# Patient Record
Sex: Female | Born: 1970 | Race: White | Hispanic: No | Marital: Married | Smoking: Never smoker
Health system: Southern US, Community
[De-identification: ages and names within clinical notes are randomized; demographics above are authoritative.]

## PROBLEM LIST (undated history)

## (undated) DIAGNOSIS — E890 Postprocedural hypothyroidism: Secondary | ICD-10-CM

## (undated) DIAGNOSIS — J309 Allergic rhinitis, unspecified: Secondary | ICD-10-CM

## (undated) DIAGNOSIS — G43909 Migraine, unspecified, not intractable, without status migrainosus: Secondary | ICD-10-CM

## (undated) DIAGNOSIS — G44009 Cluster headache syndrome, unspecified, not intractable: Secondary | ICD-10-CM

## (undated) DIAGNOSIS — C73 Malignant neoplasm of thyroid gland: Secondary | ICD-10-CM

## (undated) DIAGNOSIS — R490 Dysphonia: Secondary | ICD-10-CM

## (undated) HISTORY — DX: Malignant neoplasm of thyroid gland: C73

## (undated) HISTORY — DX: Postprocedural hypothyroidism: E89.0

## (undated) HISTORY — DX: Dysphonia: R49.0

## (undated) HISTORY — PX: CARPAL TUNNEL WITH CUBITAL TUNNEL: SHX5608

## (undated) HISTORY — DX: Cluster headache syndrome, unspecified, not intractable: G44.009

## (undated) HISTORY — DX: Migraine, unspecified, not intractable, without status migrainosus: G43.909

## (undated) HISTORY — DX: Allergic rhinitis, unspecified: J30.9

## (undated) HISTORY — PX: TYMPANOSTOMY: SHX2586

---
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
## 2008-05-31 HISTORY — PX: ABDOMINAL HYSTERECTOMY: SHX81

## 2008-05-31 HISTORY — PX: MRI: SHX5353

## 2013-01-15 LAB — T4, FREE: T4,FREE (DIRECT): 1.2

## 2013-01-15 LAB — HEPATIC FUNCTION PANEL
ALBUMIN: 4.6
ALT: 17 U/L (ref 7–35)
AST: 22 U/L
Alkaline Phosphatase: 42 U/L
BILIRUBIN TOTAL: 0.3 mg/dL

## 2013-01-15 LAB — TSH: TSH: 2.39

## 2013-01-15 LAB — CBC
HGB: 13.7 g/dL
WBC: 6.1
platelet count: 221

## 2013-01-15 LAB — BASIC METABOLIC PANEL
Creat: 0.82
Glucose: 77
Potassium: 4.9 mmol/L
Sodium: 141 mmol/L (ref 137–147)

## 2013-01-15 LAB — VITAMIN B12
FOLATE: 18.5
Vitamin B-12: 786

## 2013-05-31 DIAGNOSIS — E890 Postprocedural hypothyroidism: Secondary | ICD-10-CM

## 2013-05-31 HISTORY — DX: Postprocedural hypothyroidism: E89.0

## 2013-08-16 ENCOUNTER — Encounter: Payer: Self-pay | Admitting: Family Medicine

## 2013-08-16 ENCOUNTER — Ambulatory Visit (INDEPENDENT_AMBULATORY_CARE_PROVIDER_SITE_OTHER): Admitting: Family Medicine

## 2013-08-16 ENCOUNTER — Telehealth: Payer: Self-pay

## 2013-08-16 VITALS — BP 116/82 | HR 86 | Temp 97.6°F | Ht 67.0 in | Wt 134.1 lb

## 2013-08-16 DIAGNOSIS — E049 Nontoxic goiter, unspecified: Secondary | ICD-10-CM

## 2013-08-16 DIAGNOSIS — G44009 Cluster headache syndrome, unspecified, not intractable: Secondary | ICD-10-CM | POA: Insufficient documentation

## 2013-08-16 DIAGNOSIS — E042 Nontoxic multinodular goiter: Secondary | ICD-10-CM | POA: Insufficient documentation

## 2013-08-16 DIAGNOSIS — E010 Iodine-deficiency related diffuse (endemic) goiter: Secondary | ICD-10-CM

## 2013-08-16 DIAGNOSIS — R030 Elevated blood-pressure reading, without diagnosis of hypertension: Secondary | ICD-10-CM

## 2013-08-16 DIAGNOSIS — G43909 Migraine, unspecified, not intractable, without status migrainosus: Secondary | ICD-10-CM

## 2013-08-16 DIAGNOSIS — R51 Headache: Secondary | ICD-10-CM

## 2013-08-16 DIAGNOSIS — IMO0001 Reserved for inherently not codable concepts without codable children: Secondary | ICD-10-CM | POA: Insufficient documentation

## 2013-08-16 DIAGNOSIS — R519 Headache, unspecified: Secondary | ICD-10-CM | POA: Insufficient documentation

## 2013-08-16 MED ORDER — VERAPAMIL HCL ER 180 MG PO CP24: 180.0000 mg | ORAL_CAPSULE | Freq: Every day | ORAL | Status: DC

## 2013-08-16 MED ORDER — VERAPAMIL HCL ER 180 MG PO TBCR: 180.0000 mg | EXTENDED_RELEASE_TABLET | Freq: Every day | ORAL | Status: DC

## 2013-08-16 NOTE — Telephone Encounter (Signed)
Vicky with [facility] left v/m; pt wants to start med today. Spoke with pharmacy and has already gotten rx electronically.

## 2013-08-16 NOTE — Assessment & Plan Note (Signed)
Several different variants described - see below.

## 2013-08-16 NOTE — Assessment & Plan Note (Addendum)
Main concern is what sound like cluster headaches - discussed with patient and provided with patient educational handout Will start verapamil - discussed possible constipation side effect as well as possible hypotension - pt regularly monitors blood pressure. Return in 1-2 mo for f/u of headache variants. A total of 40 minutes were spent face-to-face with the patient during this encounter and over half of that time was spent on counseling and coordination of care

## 2013-08-16 NOTE — Telephone Encounter (Signed)
Maria Ray with [facility] pharmacy; pt was seen today and Maria Ray cannot get verapamil 180 mg capsule; Capsules are not available and Maria Ray wants to know if verapamil tabs are OK to substitute. Maria Ray request cb today.

## 2013-08-16 NOTE — Assessment & Plan Note (Addendum)
Noted today - check thyroid ultrasound.  Ordered today. Pt states had recent comprehensive panel by prior PCP - will request recent lab work today.

## 2013-08-16 NOTE — Progress Notes (Signed)
Pre visit review using our clinic review tool, if applicable. No additional management support is needed unless otherwise documented below in the visit note. 

## 2013-08-16 NOTE — Progress Notes (Signed)
BP 116/82  Pulse 86  Temp(Src) 97.6 F (36.4 C) (Oral)  Ht 5\' 7"  (1.702 m)  Wt 134 lb 1.9 oz (60.836 kg)  BMI 21.00 kg/m2   CC: new pt to establish  Subjective:    Patient ID: Maria Ray, female    DOB: July 23, 1970, 43 y.o.   MRN: [id number]  HPI: Maria Ray is a 43 y.o. female presenting on 08/16/2013 for [place] presents with husband today to establish care.  previously saw Dr. Carmie Kanner - records requested today.  Previously lived in [place].  BP concern - 127-130/85-89 at home.  Checks at [facility] and [facility].  H/o some elevated blood pressures in past.    Some nosebleeds in past - Nose sprays worsened. Saw ENT s/p cauterization.  Stable now.  3 different headache patterns - h/o chronic headaches described as mild ache/throb - no neck stiffness or muscle spasm.  Wonders if sinus related - ?vasorhinitis. Rare migraines with nausea/vomiting/photo/phonophobia improve with laying down in quiet room - maybe 2-3 per year. Once a week awakens with piercing stabbing headache that lasts 2-3 days - associated with unilateral watery eye and rhinorrhea. Toradol has helped control headaches as well. H/o normal MRI 2010. Has not tolerated nasal steroids in past. Has not tried singulair. Claritin D/allegra D didn't really help.  No fevers/chills.  Preventative: Within last 6 months Well woman - stopped after hysterectomy 08/2008 mammo - has not had yet Td 2010  Lives with husband and daughter (2000) and several animals (rabbit and ferret) Occupation: self employed - [employer]: graduate degree Activity: no regular exercise, stays active at work Diet: some water, fruits/vegetables daily  Relevant past medical, surgical, family and social history reviewed and updated as indicated.  Allergies and medications reviewed and updated. No current outpatient prescriptions on file prior to visit.   No current facility-administered medications on file prior to visit.     Review of Systems Per HPI unless specifically indicated above    Objective:    BP 116/82  Pulse 86  Temp(Src) 97.6 F (36.4 C) (Oral)  Ht 5\' 7"  (1.702 m)  Wt 134 lb 1.9 oz (60.836 kg)  BMI 21.00 kg/m2  Physical Exam  Nursing note and vitals reviewed. Constitutional: She is oriented to person, place, and time. She appears well-developed and well-nourished. No distress.  HENT:  Head: Normocephalic and atraumatic.  Right Ear: Hearing, tympanic membrane, external ear and ear canal normal.  Left Ear: Hearing, tympanic membrane, external ear and ear canal normal.  Nose: Nose normal.  Mouth/Throat: Uvula is midline, oropharynx is clear and moist and mucous membranes are normal. No oropharyngeal exudate, posterior oropharyngeal edema or posterior oropharyngeal erythema.  Eyes: Conjunctivae and EOM are normal. Pupils are equal, round, and reactive to light. No scleral icterus.  Neck: Normal range of motion. Neck supple. Thyromegaly (large R nodule) present.  Cardiovascular: Normal rate, regular rhythm, normal heart sounds and intact distal pulses.   No murmur heard. Pulses:      Radial pulses are 2+ on the right side, and 2+ on the left side.  Pulmonary/Chest: Effort normal and breath sounds normal. No respiratory distress. She has no wheezes. She has no rales.  Musculoskeletal: Normal range of motion. She exhibits no edema.  Lymphadenopathy:    She has no cervical adenopathy.  Neurological: She is alert and oriented to person, place, and time. She has normal strength. No cranial nerve deficit or sensory deficit. She displays a negative  Romberg sign. Coordination and gait normal.  CN 2-12 intact Intact FTN  Skin: Skin is warm and dry. No rash noted.  Psychiatric: She has a normal mood and affect. Her behavior is normal. Judgment and thought content normal.   No results found for this or any previous visit.    Assessment & Plan:   Problem List Items Addressed This Visit    Cluster headache     Main concern is what sound like cluster headaches - discussed with patient and provided with patient educational handout Will start verapamil - discussed possible constipation side effect as well as possible hypotension - pt regularly monitors blood pressure. Return in 1-2 mo for f/u of headache variants. A total of 40 minutes were spent face-to-face with the patient during this encounter and over half of that time was spent on counseling and coordination of care    Relevant Medications      verapamil (VERELAN PM) 24 hr capsule   Elevated BP     Overall stable. continue to monitor closely at local pharmacies.    Frequent headaches - Primary     Several different variants described - see below.    Migraine     Rare migraines - 1-2 per year, not currently bothersome.    Thyromegaly     Noted today - check thyroid ultrasound.  Ordered today. Pt states had recent comprehensive panel by prior PCP - will request recent lab work today.    Relevant Orders      US Soft Tissue Head/Neck       Follow up plan: Return in about 2 months (around 10/16/2013), or as needed, for follow up visit.

## 2013-08-16 NOTE — Assessment & Plan Note (Signed)
Rare migraines - 1-2 per year, not currently bothersome.

## 2013-08-16 NOTE — Patient Instructions (Signed)
Pass by [facility] office for referral for thyroid ultrasound Start verapamil once daily to see if we can improve headaches, ?cluster headache Return in 1-2 months for follow up Good to meet you today, call us with questions.  Cluster Headache Cluster headaches are recognized by their pattern of deep, intense head pain. They normally occur on one side of your head, but they may "switch sides" in subsequent episodes. Typically, cluster headaches:   Are severe in nature.   Occur repeatedly over weeks to months and are followed by periods of no headaches.   Can last from 15 minutes to 3 hours.   Occur at the same time each day, often at night.   Occur several times a day. CAUSES The exact cause of cluster headaches is not known. Alcohol use may be associated with cluster headaches. SIGNS AND SYMPTOMS   Severe pain that begins in or around your eye or temple.   One-sided head pain.   Feeling sick to your stomach (nauseous).   Sensitivity to light.   Runny nose.   Eye redness, tearing, and nasal stuffiness on the side of your head where you are experiencing pain.   Sweaty, pale skin of the face.   Droopy or swollen eyelid.   Restlessness. DIAGNOSIS  Cluster headaches are diagnosed based on symptoms and a physical exam. Your health care provider may order a CT scan or an MRI of your head or lab tests to see if your headaches are caused by other medical conditions.  TREATMENT   Medicines for pain relief and to prevent recurrent attacks. Some people may need a combination of medicines.  Oxygen for pain relief.   Biofeedback programs to help reduce headache pain.  It may be helpful to keep a headache diary. This may help you find a trend for what is triggering your headaches. Your health care provider can develop a treatment plan.  HOME CARE INSTRUCTIONS  During cluster periods:   Follow a regular sleep schedule. Do not vary the amount and time that you sleep  from day to day. It is important to stay on the same schedule during a cluster period to help prevent headaches.   Avoid alcohol.   Stop smoking if you smoke.  SEEK MEDICAL CARE IF:  You have any changes from your previous cluster headaches either in intensity or frequency.   You are not getting relief from medicines you are taking.  SEEK IMMEDIATE MEDICAL CARE IF:   You faint.   You have weakness or numbness, especially on one side of your body or face.   You have double vision.   You have nausea or vomiting that is not relieved within several hours.   You cannot keep your balance or have difficulty talking or walking.   You have neck pain or stiffness.   You have a fever. MAKE SURE YOU:  Understand these instructions.   Will watch your condition.   Will get help right away if you are not doing well or get worse. Document Released: 05/17/2005 Document Revised: 03/07/2013 Document Reviewed: 12/07/2012 [facility] Patient Information 2014 [place].

## 2013-08-16 NOTE — Assessment & Plan Note (Signed)
Overall stable. continue to monitor closely at local pharmacies.

## 2013-08-16 NOTE — Telephone Encounter (Signed)
Pharmacy notified.

## 2013-08-16 NOTE — Telephone Encounter (Signed)
Ok to substitute. plz notify pharmacy.  

## 2013-08-17 ENCOUNTER — Encounter: Payer: Self-pay | Admitting: Family Medicine

## 2013-08-17 ENCOUNTER — Telehealth: Payer: Self-pay

## 2013-08-17 ENCOUNTER — Ambulatory Visit (INDEPENDENT_AMBULATORY_CARE_PROVIDER_SITE_OTHER): Admitting: Family Medicine

## 2013-08-17 VITALS — BP 126/84 | HR 76 | Temp 97.5°F | Wt 134.0 lb

## 2013-08-17 DIAGNOSIS — T50905A Adverse effect of unspecified drugs, medicaments and biological substances, initial encounter: Secondary | ICD-10-CM | POA: Insufficient documentation

## 2013-08-17 DIAGNOSIS — T887XXA Unspecified adverse effect of drug or medicament, initial encounter: Secondary | ICD-10-CM

## 2013-08-17 NOTE — Patient Instructions (Signed)
This was likely bad reaction to too extended release verapamil. Stop this med.  Return home and rest today.  Push small sips of fluids.  Update me if not improving over the weekend. Your blood pressure was ok today. Return to see me next month.

## 2013-08-17 NOTE — Telephone Encounter (Signed)
Spoke with patient. Likely due to verapamil - HR 64.  I've asked them to come in now for eval and vital check.  Aware if BP low will need to go to [facility] or ER for IVF. plz place on my schedule

## 2013-08-17 NOTE — Telephone Encounter (Signed)
Seen today. 

## 2013-08-17 NOTE — Telephone Encounter (Signed)
Pt placed on schedule as instructed.

## 2013-08-17 NOTE — Progress Notes (Signed)
   BP 126/84  Pulse 76  Temp(Src) 97.5 F (36.4 C) (Oral)  Wt 134 lb (60.782 kg)   CC: f/u  Subjective:    Patient ID: Maria Ray, female    DOB: 1970-10-26, 43 y.o.   MRN: [id number]  HPI: Maria Ray is a 43 y.o. female presenting on 08/17/2013 for Headache and Dizziness   See recent phone note and yesterday's office visit.  Briefly, yesterday dx with cluster headaches, started on verapamil SR 180mg  once nightly.  Took first pill last night, this morning awoke with chills, headache, nausea and vomiting, and dizziness.  Vomiting x 1.  Presents today for further eval.  Woke up at 4am and felt fine, then at 7:15am woke up with headache, ok during shower.  Started feeling ill at 8:50am.  + diarrhea, no constipation.  No chest pain.    No recent sick contacts.  Slowly feeling better. Drinking ok - staying well hydrated.  Relevant past medical, surgical, family and social history reviewed and updated as indicated.  Allergies and medications reviewed and updated. Current Outpatient Prescriptions on File Prior to Visit  Medication Sig  . Biotin 5000 MCG CAPS Take 10,000 mcg by mouth.  . cholecalciferol (VITAMIN D) 1000 UNITS tablet Take 1,000 Units by mouth daily.  . Garlic 1017 MG CAPS Take 1 capsule by mouth.  . Ginkgo Biloba 40 MG TABS Take 120 mg by mouth.  . magnesium oxide (MAG-OX) 400 MG tablet Take 800 mg by mouth daily.  . Multiple Vitamin (MULTIVITAMIN) tablet Take 1 tablet by mouth daily.  . OMEGA-3 KRILL OIL PO Take 1 capsule by mouth.   No current facility-administered medications on file prior to visit.    Review of Systems Per HPI unless specifically indicated above    Objective:    BP 126/84  Pulse 76  Temp(Src) 97.5 F (36.4 C) (Oral)  Wt 134 lb (60.782 kg)  Physical Exam  Nursing note and vitals reviewed. Constitutional: She appears well-developed and well-nourished. No distress.  HENT:  Mouth/Throat: Oropharynx is clear and moist. No oropharyngeal exudate.    Eyes: Conjunctivae and EOM are normal. Pupils are equal, round, and reactive to light. No scleral icterus.  Cardiovascular: Normal rate, regular rhythm, normal heart sounds and intact distal pulses.   No murmur heard. Pulmonary/Chest: Effort normal and breath sounds normal. No respiratory distress. She has no wheezes. She has no rales.  Musculoskeletal: She exhibits no edema.  Skin: Skin is warm and dry. No rash noted.  Psychiatric: She has a normal mood and affect.   No results found for this or any previous visit.    Assessment & Plan:   Problem List Items Addressed This Visit   Adverse drug reaction - Primary     Anticipate reaction to verapamil - with hypotension leading to nausea/dizziness.  Only took 1 180mg  SR calan dose. Seems to be resolving.  Provided with water, if tolerated ok to return home.  Orthostatics normal today. Less likely viral gastro.        Follow up plan: No Follow-up on file.

## 2013-08-17 NOTE — Assessment & Plan Note (Signed)
Anticipate reaction to verapamil - with hypotension leading to nausea/dizziness.  Only took 1 180mg  SR calan dose. Seems to be resolving.  Provided with water, if tolerated ok to return home.  Orthostatics normal today. Less likely viral gastro.

## 2013-08-17 NOTE — Progress Notes (Signed)
Pre visit review using our clinic review tool, if applicable. No additional management support is needed unless otherwise documented below in the visit note. 

## 2013-08-17 NOTE — Telephone Encounter (Signed)
[name] said pt took Verapamil last night at 7:30 PM; when pt woke up this morning had severe H/A with dizziness, nausea, weakness, pt felt like BP has dropped; does not have BP cuff at home. Presently pt is laying down and all symptoms continue with pain level 10. [address]Please advise.

## 2013-08-23 ENCOUNTER — Ambulatory Visit: Payer: Self-pay | Admitting: Family Medicine

## 2013-08-23 IMAGING — US THYROID ULTRASOUND
1 series · 14 of 25 positions shown · non-contrast
Comparison: None.

CLINICAL DATA: Thyromegaly

EXAM:
THYROID ULTRASOUND
TECHNIQUE: Ultrasound examination of the thyroid gland and adjacent soft
tissues was performed.

[Series 1: thyroid ultrasound · 0.08mm/px · 14 of 57 slices shown]
[im 1/57]
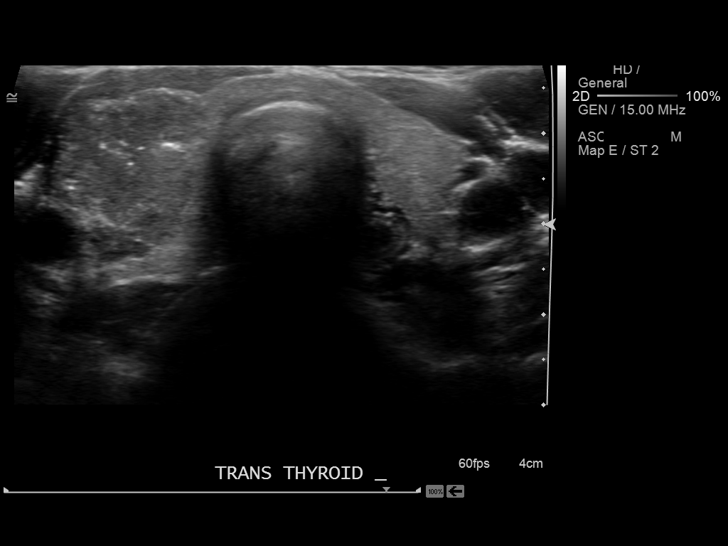
[im 5/57]
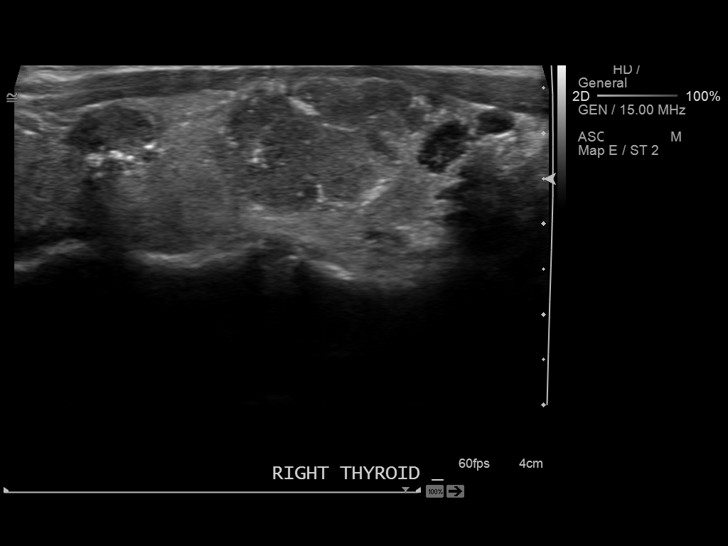
[im 10/57]
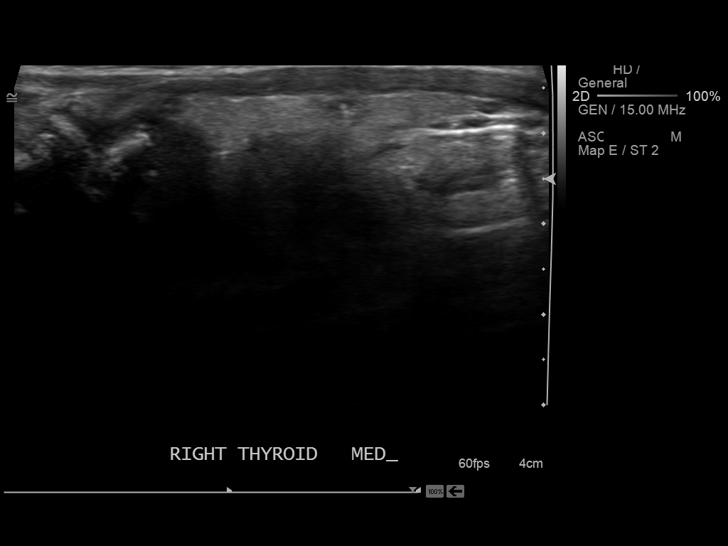
[im 15/57]
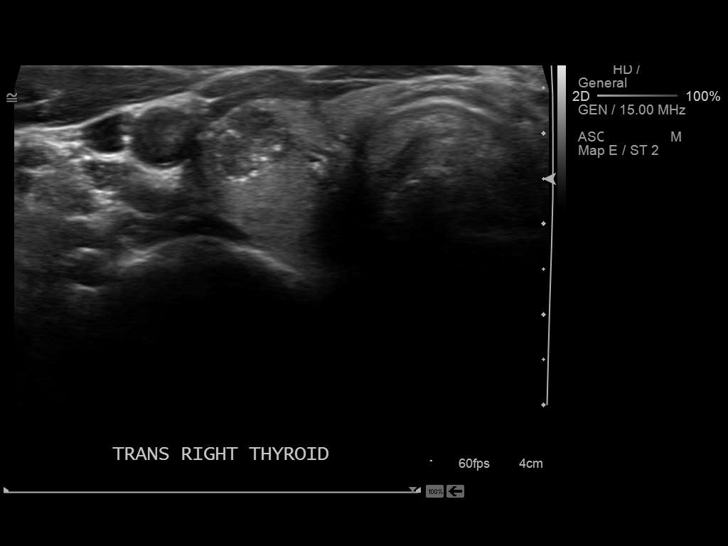
[im 19/57]
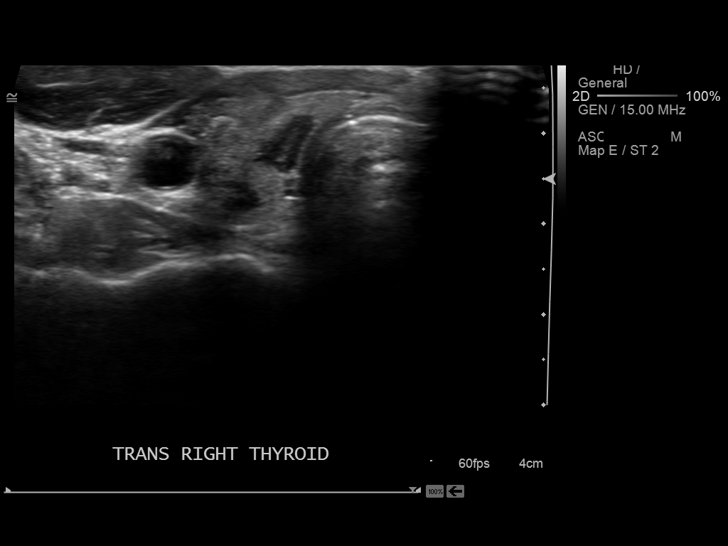
[im 22/57]
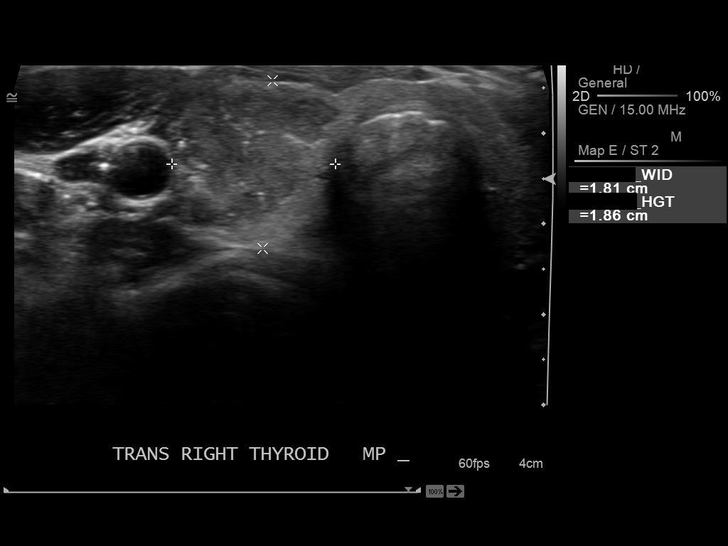
[im 26/57]
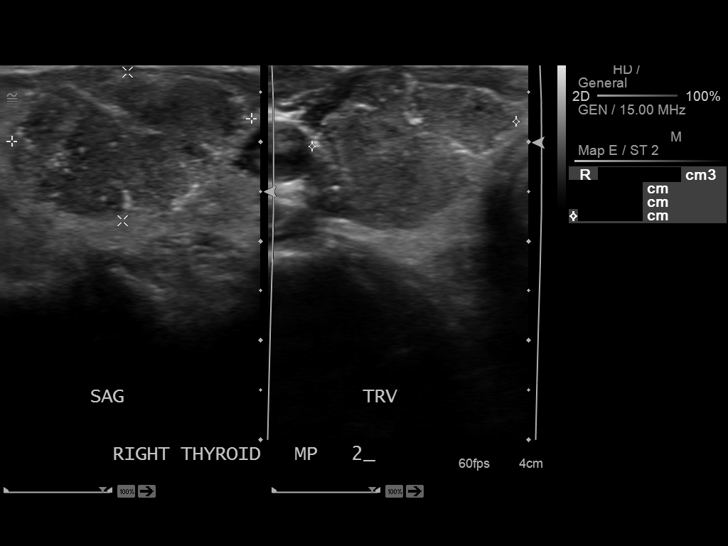
[im 31/57]
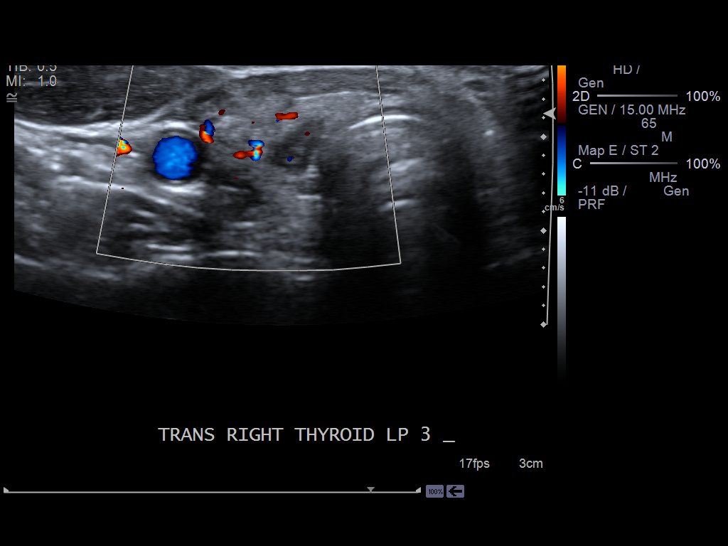
[im 36/57]
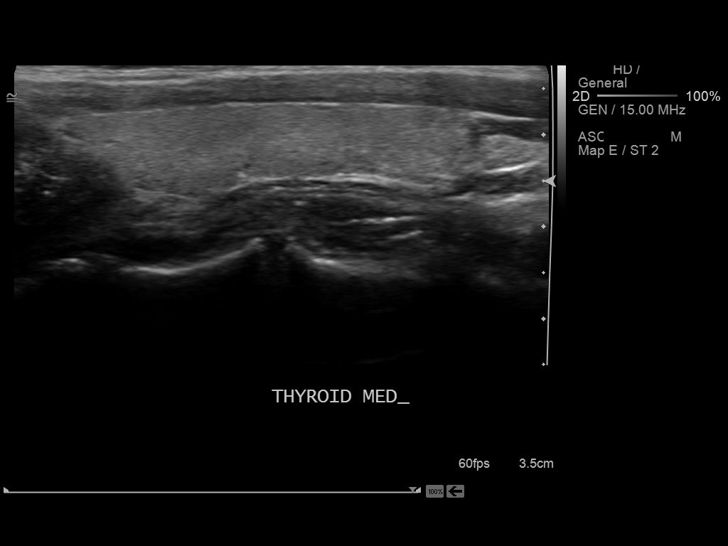
[im 38/57]
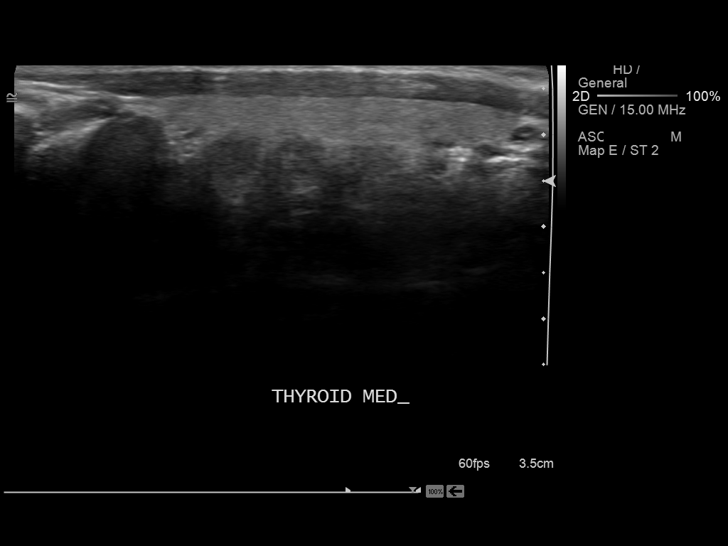
[im 43/57]
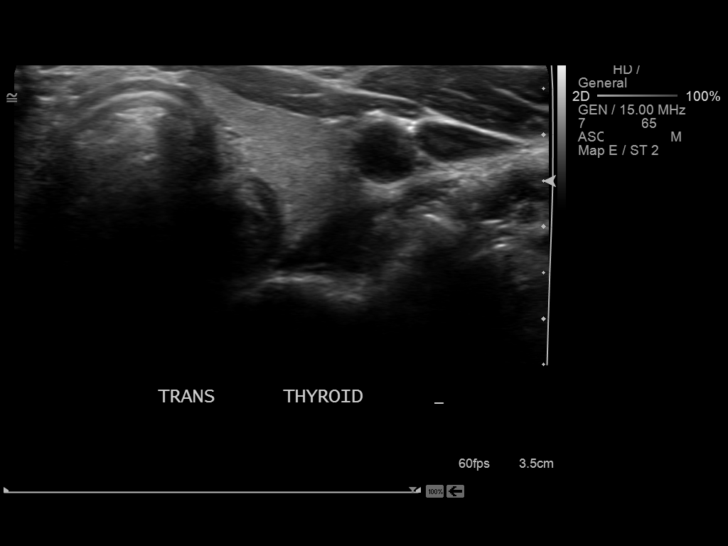
[im 47/57]
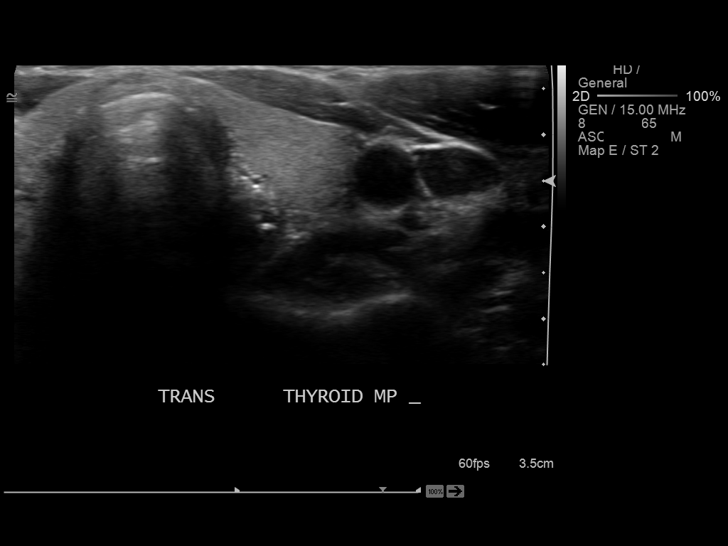
[im 52/57]
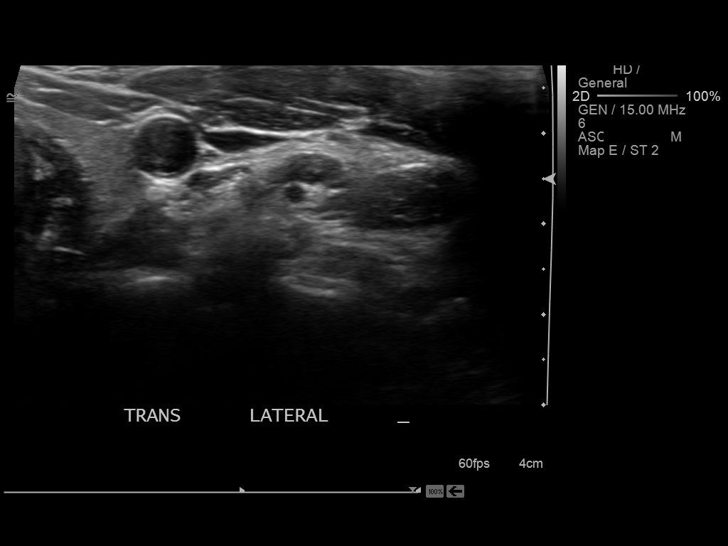
[im 57/57]
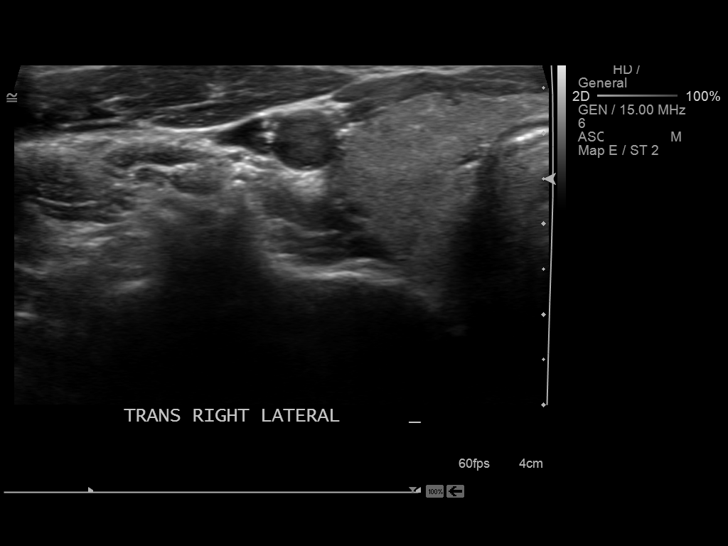

[14 of 25 positions shown; findings below may reference images not displayed]

FINDINGS: Right thyroid lobe

Measurements: 5.4 x 1.9 x 1.8 cm.

A 0.9 x 0.9 x 1 cm hypoechoic solid-appearing nodule with
calcifications upper pole. Solid-appearing hypoechoic nodule
calcifications measuring 2.4 x 1.5 x 2 cm midpole. 0.7 x 0.5 x
cm complex appearing nodule lower pole.

Left thyroid lobe

Measurements: 5.2 x 1.1 x 1.4 cm.  No nodules visualized.

Isthmus

Thickness: 0.2 cm.  No nodules visualized.

Lymphadenopathy

None visualized.
IMPRESSION: Multiple nodules within the right lobe of the thyroid. The dominant
midpole 2.4 cm nodule meets consensus criteria for biopsy.
Ultrasound-guided fine needle aspiration should be considered, as
per the consensus statement: Management of Thyroid Nodules Detected
at US: Society of Radiologists in Ultrasound Consensus Conference

## 2013-08-24 ENCOUNTER — Encounter: Payer: Self-pay | Admitting: Family Medicine

## 2013-08-27 ENCOUNTER — Other Ambulatory Visit: Payer: Self-pay | Admitting: Family Medicine

## 2013-08-27 DIAGNOSIS — E042 Nontoxic multinodular goiter: Secondary | ICD-10-CM

## 2013-08-30 ENCOUNTER — Encounter: Payer: Self-pay | Admitting: Family Medicine

## 2013-08-30 ENCOUNTER — Ambulatory Visit: Payer: Self-pay | Admitting: Family Medicine

## 2013-08-30 IMAGING — US ULTRASOUND CORE BIOPSY
1 series · 14 of 18 positions shown · non-contrast
Comparison: none

CLINICAL DATA: Thyroid nodule.

[Series 1: ultrasound core biopsy · 0.07mm/px · 18 acquisitions, 14 frames shown]
[im 1/18]
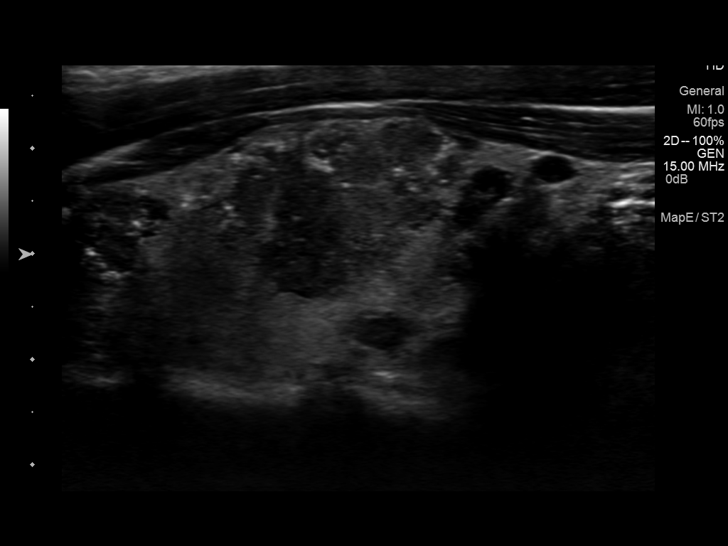
[im 2/18]
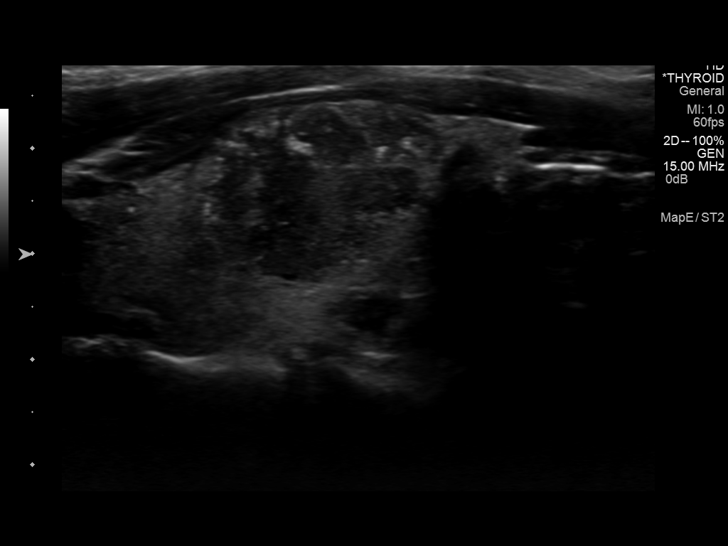
[im 4/18]
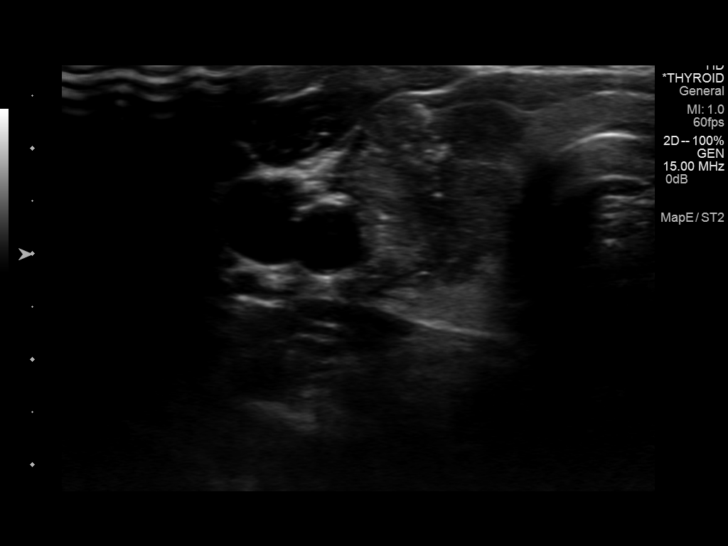
[im 5/18]
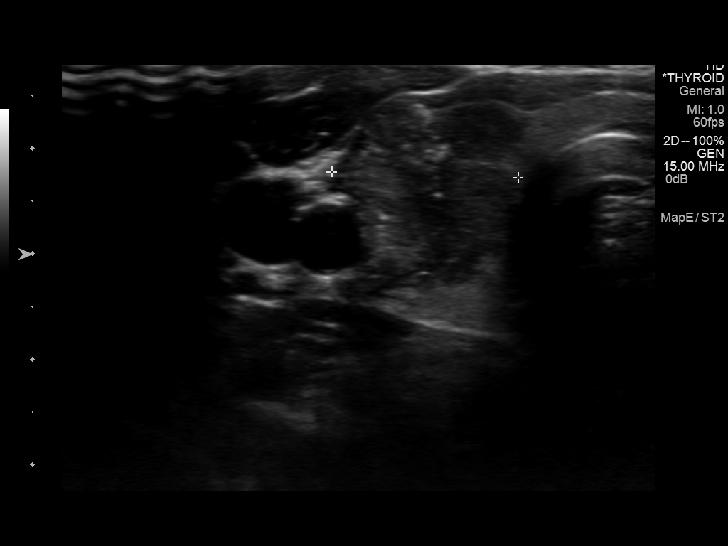
[im 6/18]
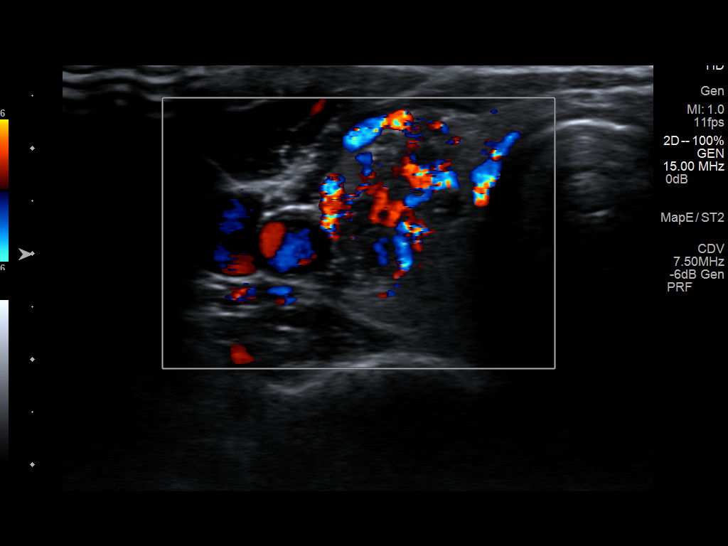
[im 8/18]
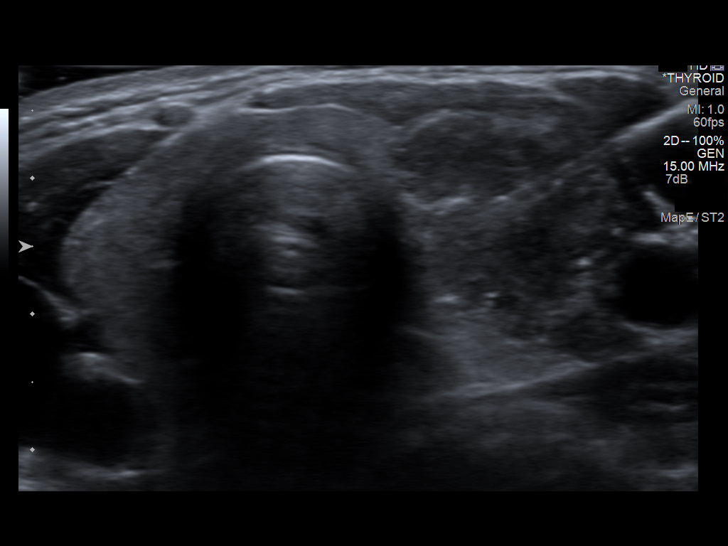
[im 9/18]
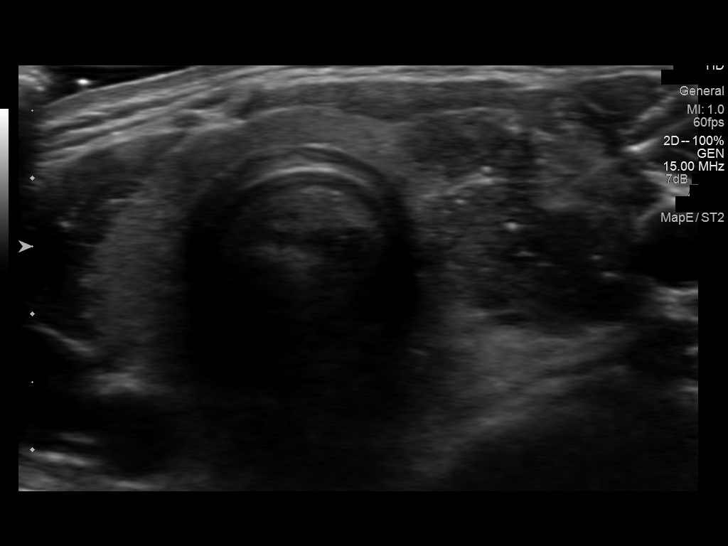
[im 10/18]
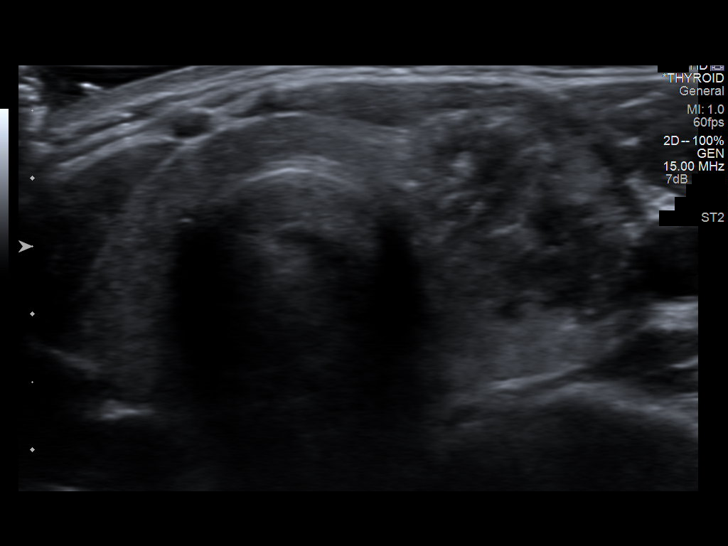
[im 11/18]
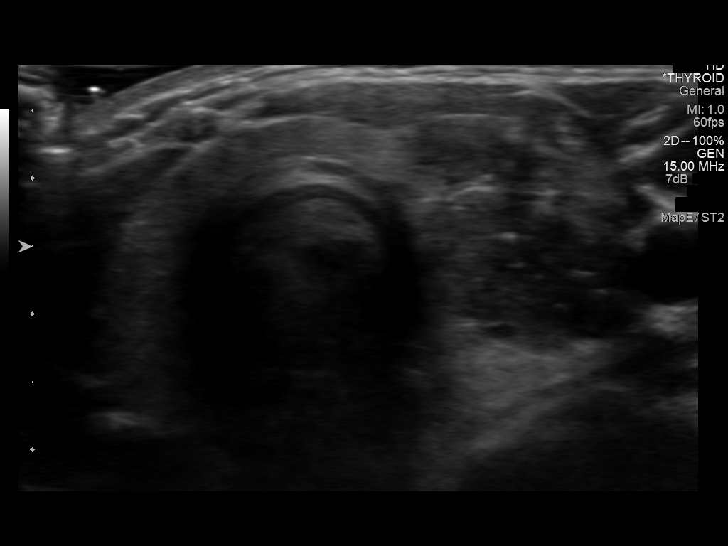
[im 13/18]
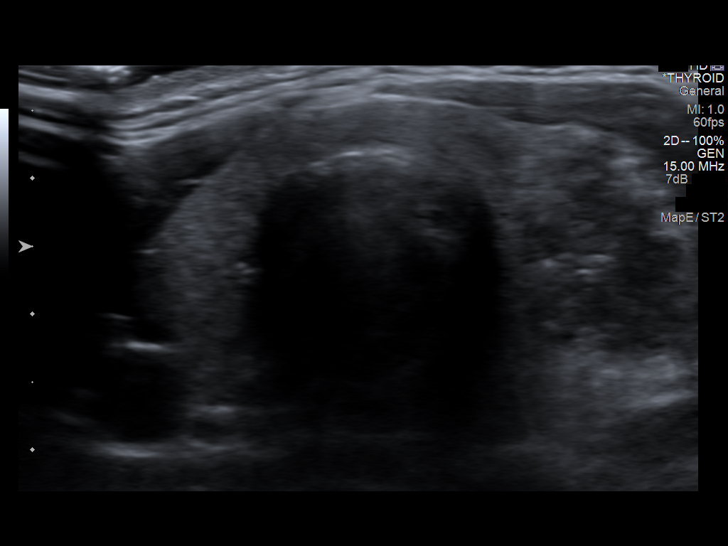
[im 14/18]
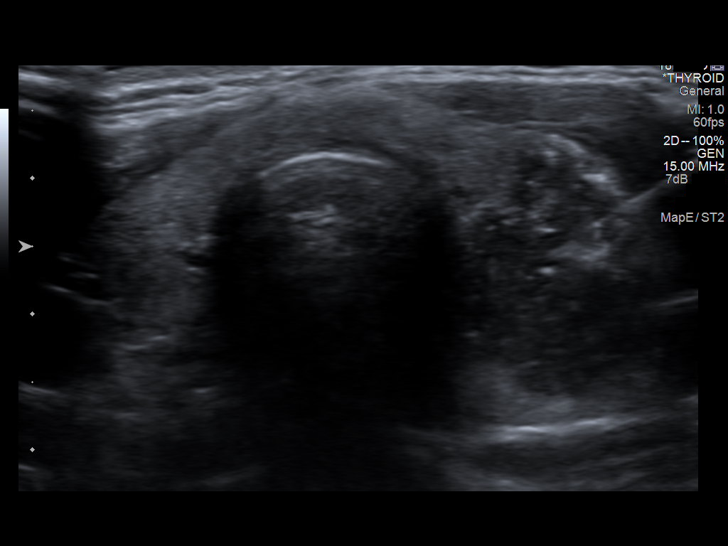
[im 15/18]
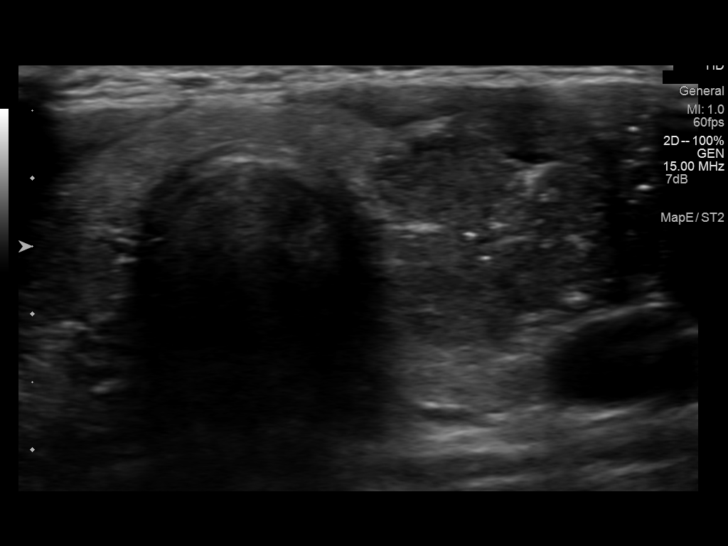
[im 17/18]
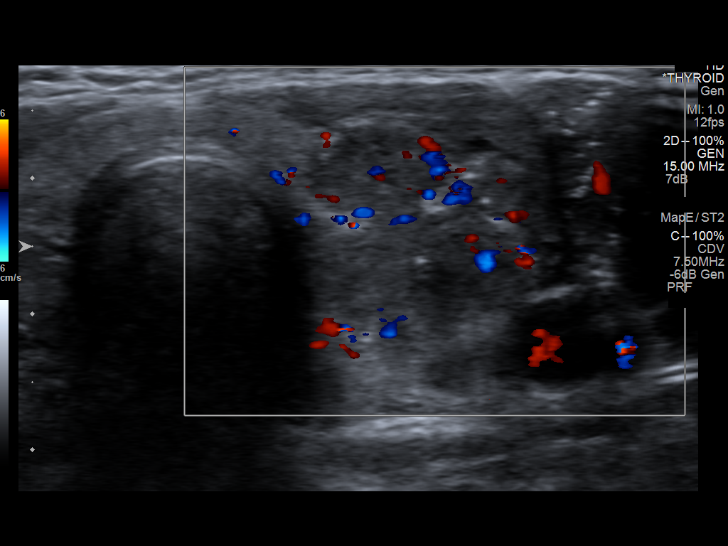
[im 18/18]
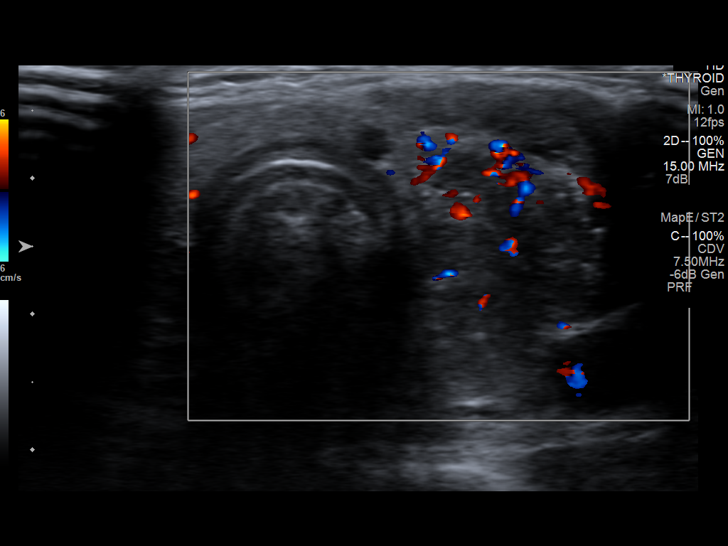

[14 of 18 positions shown; findings below may reference images not displayed]

EXAM:
ULTRASOUND GUIDED NEEDLE ASPIRATE BIOPSY OF BODY PART

MEDICATIONS:
None.

PROCEDURE:
The procedure, risks, benefits, and alternatives were explained to
the patient. Questions regarding the procedure were encouraged and
answered. The patient understands and consents to the procedure.

The neck was prepped with Betadine in a sterile fashion, and a
sterile drape was applied covering the operative field. A sterile
gown and sterile gloves were used for the procedure. Local
anesthesia was provided with 1% Lidocaine.

Three separate 25 gauge needles were advanced into the right thyroid
gland nodule and several obtained throughout multiple sludge within
the nodule. No complications.

COMPLICATIONS:
None.
FINDINGS: Successful right thyroid gland nodule biopsy. Three separate samples
obtained. No complications.
IMPRESSION: Successful right thyroid gland nodule biopsy.

## 2013-09-03 ENCOUNTER — Other Ambulatory Visit: Payer: Self-pay | Admitting: Family Medicine

## 2013-09-03 ENCOUNTER — Encounter: Payer: Self-pay | Admitting: Family Medicine

## 2013-09-03 DIAGNOSIS — C73 Malignant neoplasm of thyroid gland: Secondary | ICD-10-CM

## 2013-09-03 HISTORY — DX: Malignant neoplasm of thyroid gland: C73

## 2013-09-04 ENCOUNTER — Encounter: Payer: Self-pay | Admitting: Family Medicine

## 2013-09-04 ENCOUNTER — Telehealth: Payer: Self-pay

## 2013-09-04 NOTE — Telephone Encounter (Signed)
Maria Ray with [employer] called to verify Dr Darnell Level had received report from [facility] fine needle aspiration on 08/30/13; positive for malignant cells. Report was under media tab. And copy put in Dr Synthia Innocent in box.

## 2013-09-04 NOTE — Telephone Encounter (Signed)
Noted. gen surg appt scheduled for next week.

## 2013-09-11 ENCOUNTER — Ambulatory Visit (INDEPENDENT_AMBULATORY_CARE_PROVIDER_SITE_OTHER): Admitting: Surgery

## 2013-09-11 ENCOUNTER — Encounter (INDEPENDENT_AMBULATORY_CARE_PROVIDER_SITE_OTHER): Payer: Self-pay | Admitting: Surgery

## 2013-09-11 VITALS — BP 121/78 | HR 72 | Temp 97.7°F | Resp 18 | Ht 67.0 in | Wt 132.0 lb

## 2013-09-11 DIAGNOSIS — C73 Malignant neoplasm of thyroid gland: Secondary | ICD-10-CM

## 2013-09-11 NOTE — Progress Notes (Signed)
Patient ID: Maria Ray, female   DOB: 06-30-1970, 43 y.o.   MRN: [id number]  Chief Complaint  Patient presents with  . Thyroid Problem    HPI Maria Ray is a 43 y.o. female.   HPI This is a very pleasant female referred by Dr. Danise Mina for evaluation of a recent diagnosis of papillary thyroid cancer. She has been feeling fatigued over the last several years. She recently saw Dr. Danise Mina who immediately palpated a right thyroid nodule. She has since had an ultrasound and a biopsy confirming papillary thyroid cancer. Since the biopsy, she has had mild difficulty swallowing and weakness in her voice but is otherwise without complaints. She has no previous history of malignancy. Past Medical History  Diagnosis Date  . Frequent headaches   . Migraine   . Cluster headache   . Thyroid nodule 07/2013    right  . Papillary thyroid carcinoma 09/03/2013  . Allergic rhinitis     Past Surgical History  Procedure Laterality Date  . Tonsillectomy and adenoidectomy  1976  . Partial hysterectomy  2010    heavy bleeding, ovaries remain  . Mri  2010    brain WNL per patient  . Tympanostomy  child  . Abdominal hysterectomy      Family History  Problem Relation Age of Onset  . Hypertension Father   . Cancer Paternal Grandfather 55    colon  . Cancer Maternal Grandfather     bone?  . Stroke Neg Hx   . Diabetes Neg Hx   . CAD Neg Hx     Social History History  Substance Use Topics  . Smoking status: Never Smoker   . Smokeless tobacco: Never Used  . Alcohol Use: No    No Known Allergies  Current Outpatient Prescriptions  Medication Sig Dispense Refill  . Biotin 5000 MCG CAPS Take 10,000 mcg by mouth.      . cholecalciferol (VITAMIN D) 1000 UNITS tablet Take 1,000 Units by mouth daily.      . Garlic 4098 MG CAPS Take 1 capsule by mouth.      . Ginkgo Biloba 40 MG TABS Take 120 mg by mouth.      . magnesium oxide (MAG-OX) 400 MG tablet Take 800 mg by mouth daily.      . Multiple  Vitamin (MULTIVITAMIN) tablet Take 1 tablet by mouth daily.      . OMEGA-3 KRILL OIL PO Take 1 capsule by mouth.       No current facility-administered medications for this visit.    Review of Systems Review of Systems  Constitutional: Negative for fever, chills and unexpected weight change.  HENT: Positive for voice change. Negative for congestion, hearing loss, sore throat and trouble swallowing.   Eyes: Negative for visual disturbance.  Respiratory: Positive for cough. Negative for wheezing.   Cardiovascular: Positive for palpitations. Negative for chest pain and leg swelling.  Gastrointestinal: Negative for nausea, vomiting, abdominal pain, diarrhea, constipation, blood in stool, abdominal distention and anal bleeding.  Genitourinary: Negative for hematuria, vaginal bleeding and difficulty urinating.  Musculoskeletal: Negative for arthralgias.  Skin: Negative for rash and wound.  Neurological: Positive for headaches. Negative for seizures and syncope.  Hematological: Negative for adenopathy. Does not bruise/bleed easily.  Psychiatric/Behavioral: Negative for confusion.    Blood pressure 121/78, pulse 72, temperature 97.7 F (36.5 C), temperature source Oral, resp. rate 18, height 5\' 7"  (1.702 m), weight 132 lb (59.875 kg).  Physical Exam Physical Exam  Constitutional: She is oriented  to person, place, and time. She appears well-developed and well-nourished. No distress.  HENT:  Head: Normocephalic and atraumatic.  Right Ear: External ear normal.  Left Ear: External ear normal.  Nose: Nose normal.  Mouth/Throat: Oropharynx is clear and moist. No oropharyngeal exudate.  Eyes: Conjunctivae are normal. Pupils are equal, round, and reactive to light. Right eye exhibits no discharge. Left eye exhibits no discharge. No scleral icterus.  Neck: Normal range of motion. Neck supple.  There is a palpable right thyroid nodule  Cardiovascular: Normal rate, regular rhythm, normal heart  sounds and intact distal pulses.   No murmur heard. Pulmonary/Chest: Effort normal and breath sounds normal. No respiratory distress. She has no wheezes. She has no rales.  Abdominal: Soft. Bowel sounds are normal. She exhibits no distension. There is no tenderness. There is no rebound.  Musculoskeletal: Normal range of motion. She exhibits no edema and no tenderness.  Lymphadenopathy:    She has no cervical adenopathy.  Neurological: She is alert and oriented to person, place, and time.  Skin: Skin is warm and dry. No rash noted. She is not diaphoretic. No erythema.  Psychiatric: Her behavior is normal. Judgment normal.    Data Reviewed I reviewed the ultrasound showing multiple nodules on the right thyroid lobe with no nodules present on the left side. The fine-needle aspiration showed papillary thyroid cancer  Assessment    Papillary thyroid cancer     Plan    I discussed the diagnosis with the patient and her husband in detail. They have already done a lot of research on the matter themselves. We discussed subtotal thyroidectomy versus right thyroid lobectomy. Most would recommend subtotal thyroidectomy. Recent studies have shown that all small nodules with papillary cancer that a right thyroid lobectomy is reasonable with a 10-20% chance of recurrence in the upper lobe which does not change long-term survival. Given her overall symptoms, I would recommend the subtotal thyroidectomy which she is in agreement with. I discussed the risks of surgery which includes but is not limited to bleeding, infection, injury to the recurrent laryngeal nerves, permanent hoarseness, injury to the parathyroid glands with need for permanent replacement of calcium. She also understands she will need to go on parathyroid hormone replacement postoperatively. Subtotal thyroidectomy will be scheduled        Harl Bowie 09/11/2013, 9:47 AM

## 2013-09-18 ENCOUNTER — Encounter: Payer: Self-pay | Admitting: *Deleted

## 2013-09-26 ENCOUNTER — Encounter (HOSPITAL_COMMUNITY): Payer: Self-pay | Admitting: Pharmacy Technician

## 2013-09-27 ENCOUNTER — Other Ambulatory Visit (INDEPENDENT_AMBULATORY_CARE_PROVIDER_SITE_OTHER): Payer: Self-pay | Admitting: Surgery

## 2013-09-27 ENCOUNTER — Ambulatory Visit: Admitting: Family Medicine

## 2013-09-29 NOTE — Pre-Procedure Instructions (Signed)
[place] - Preparing for Surgery  Before surgery, you can play an important role.  Because skin is not sterile, your skin needs to be as free of germs as possible.  You can reduce the number of germs on you skin by washing with CHG (chlorahexidine gluconate) soap before surgery.  CHG is an antiseptic cleaner which kills germs and bonds with the skin to continue killing germs even after washing.  Please DO NOT use if you have an allergy to CHG or antibacterial soaps.  If your skin becomes reddened/irritated stop using the CHG and inform your nurse when you arrive at Short Stay.  Do not shave (including legs and underarms) for at least 48 hours prior to the first CHG shower.  You may shave your face.  Please follow these instructions carefully:   1.  Shower with CHG Soap the night before surgery and the morning of Surgery.  2.  If you choose to wash your hair, wash your hair first as usual with your normal shampoo.  3.  After you shampoo, rinse your hair and body thoroughly to remove the shampoo.  4.  Use CHG as you would any other liquid soap.  You can apply CHG directly to the skin and wash gently with scrungie or a clean washcloth.  5.  Apply the CHG Soap to your body ONLY FROM THE NECK DOWN.  Do not use on open wounds or open sores.  Avoid contact with your eyes, ears, mouth and genitals (private parts).  Wash genitals (private parts) with your normal soap.  6.  Wash thoroughly, paying special attention to the area where your surgery will be performed.  7.  Thoroughly rinse your body with warm water from the neck down.  8.  DO NOT shower/wash with your normal soap after using and rinsing off the CHG Soap.  9.  Pat yourself dry with a clean towel.            10.  Wear clean pajamas.            11.  Place clean sheets on your bed the night of your first shower and do not sleep with pets.  Day of Surgery  Do not apply any lotions the morning of surgery.  Please wear clean clothes to the  hospital/surgery center.   

## 2013-09-29 NOTE — Pre-Procedure Instructions (Signed)
KIAYA HALIBURTON  09/29/2013   Your procedure is scheduled on:  May 12  Report to [facility] Admitting at 05:30 AM.  Call this number if you have problems the morning of surgery: [phone]   Remember:   Do not eat food or drink liquids after midnight.   Take these medicines the morning of surgery with A SIP OF WATER: None   STOP Excedrin Migraine today   STOP/ Do not take Aspirin, Aleve, Naproxen, Advil, Ibuprofen, Vitamin, Herbs, or Supplements starting today   Do not wear jewelry, make-up or nail polish.  Do not wear lotions, powders, or perfumes. You may wear deodorant.  Do not shave 48 hours prior to surgery. Men may shave face and neck.  Do not bring valuables to the hospital.  [facility] is not responsible for any belongings or valuables.               Contacts, dentures or bridgework may not be worn into surgery.  Leave suitcase in the car. After surgery it may be brought to your room.  For patients admitted to the hospital, discharge time is determined by your treatment team.               Patients discharged the day of surgery will not be allowed to drive home.  Name and phone number of your driver: Family/ Friend  Special Instructions: See [place] Preparing For Surgery   Please read over the following fact sheets that you were given: Pain Booklet, Coughing and Deep Breathing and Surgical Site Infection Prevention

## 2013-10-01 ENCOUNTER — Encounter (HOSPITAL_COMMUNITY)
Admission: RE | Admit: 2013-10-01 | Discharge: 2013-10-01 | Disposition: A | Discharge status: Home / Self Care | Source: Ambulatory Visit | Attending: Anesthesiology | Admitting: Anesthesiology

## 2013-10-01 ENCOUNTER — Encounter (HOSPITAL_COMMUNITY)
Admission: RE | Admit: 2013-10-01 | Discharge: 2013-10-01 | Disposition: A | Discharge status: Home / Self Care | Source: Ambulatory Visit | Attending: Surgery | Admitting: Surgery

## 2013-10-01 ENCOUNTER — Encounter (HOSPITAL_COMMUNITY): Payer: Self-pay

## 2013-10-01 ENCOUNTER — Encounter (HOSPITAL_COMMUNITY): Payer: Self-pay | Admitting: Pharmacy Technician

## 2013-10-01 DIAGNOSIS — Z01818 Encounter for other preprocedural examination: Secondary | ICD-10-CM | POA: Insufficient documentation

## 2013-10-01 DIAGNOSIS — Z01812 Encounter for preprocedural laboratory examination: Secondary | ICD-10-CM | POA: Insufficient documentation

## 2013-10-01 DIAGNOSIS — R0602 Shortness of breath: Secondary | ICD-10-CM | POA: Insufficient documentation

## 2013-10-01 LAB — CBC
HEMATOCRIT: 40.4 % (ref 36.0–46.0)
Hemoglobin: 14 g/dL (ref 12.0–15.0)
MCH: 31.3 pg (ref 26.0–34.0)
MCHC: 34.7 g/dL (ref 30.0–36.0)
MCV: 90.4 fL (ref 78.0–100.0)
PLATELETS: 235 10*3/uL (ref 150–400)
RBC: 4.47 MIL/uL (ref 3.87–5.11)
RDW: 12.2 % (ref 11.5–15.5)
WBC: 8.1 10*3/uL (ref 4.0–10.5)

## 2013-10-01 IMAGING — CR DG CHEST 2V
2 series · 2 of 2 positions shown · non-contrast
Comparison: None.

CLINICAL DATA: Shortness of breath.

EXAM:
CHEST  2 VIEW

[w chest pa]
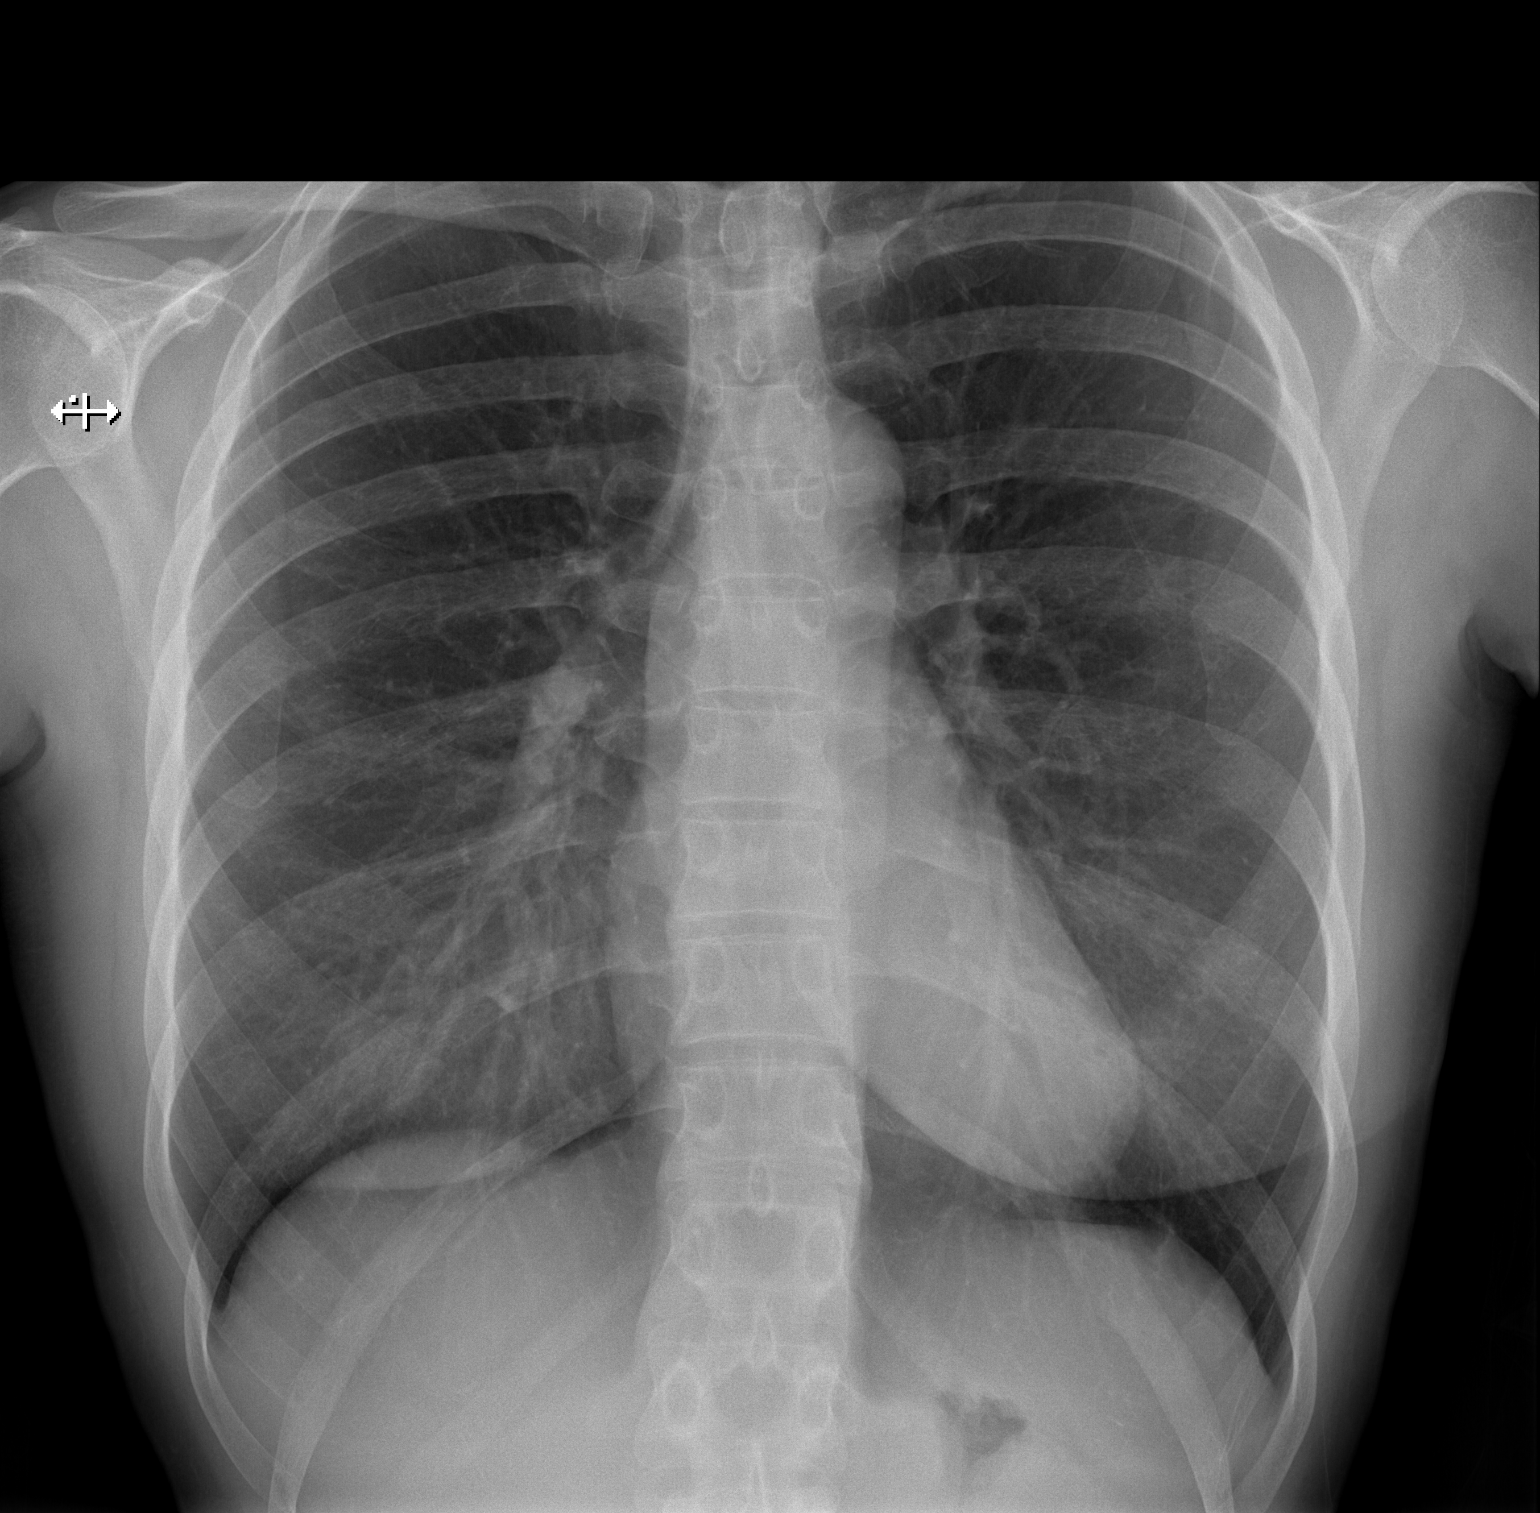

[w chest lat]
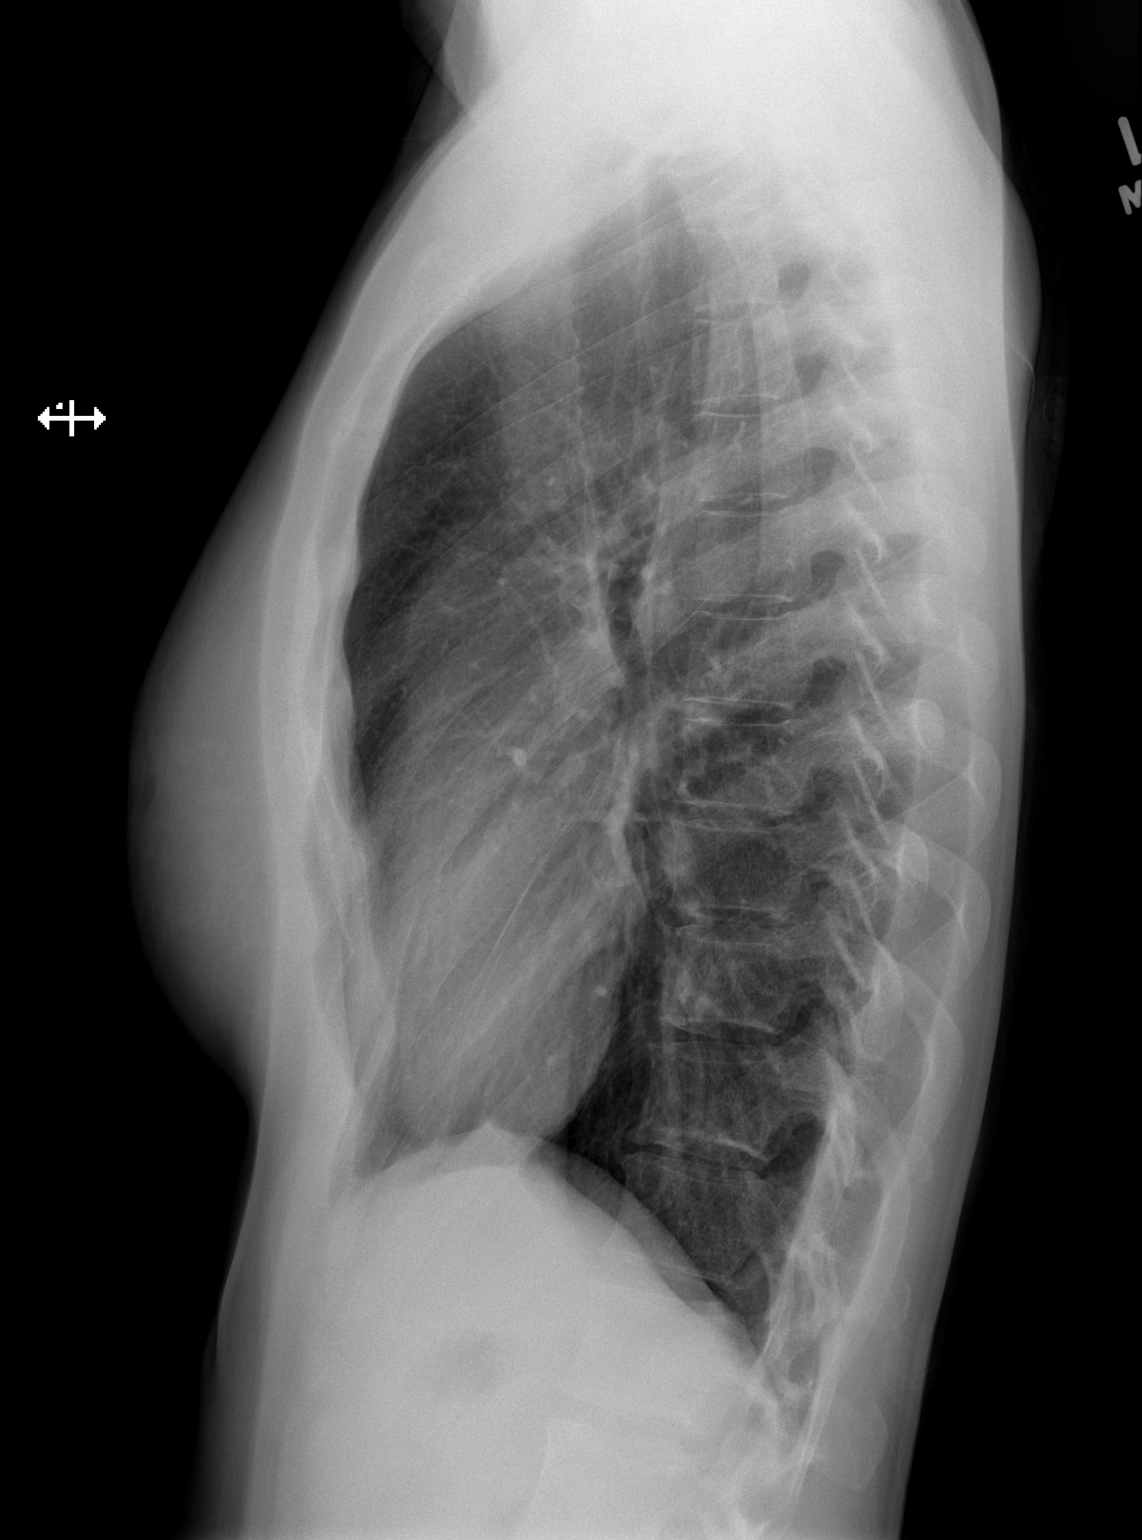

[2 of 2 positions shown; findings below may reference images not displayed]

FINDINGS: The heart size and mediastinal contours are within normal limits.
Both lungs are clear. The visualized skeletal structures are
unremarkable.
IMPRESSION: No active cardiopulmonary disease.

## 2013-10-01 NOTE — Progress Notes (Signed)
Primary: Dr. Danise Mina At [place] in [place]

## 2013-10-08 MED ORDER — CEFAZOLIN SODIUM-DEXTROSE 2-3 GM-% IV SOLR
2.0000 g | INTRAVENOUS | Status: AC
  Administered 2013-10-09: 2 g via INTRAVENOUS
  Filled 2013-10-08: qty 50

## 2013-10-08 NOTE — H&P (Signed)
Chief Complaint   Patient presents with   .  Thyroid Problem   HPI  Maria Ray is a 43 y.o. female.  HPI  This is a very pleasant female referred by Dr. Danise Mina for evaluation of a recent diagnosis of papillary thyroid cancer. She has been feeling fatigued over the last several years. She recently saw Dr. Danise Mina who immediately palpated a right thyroid nodule. She has since had an ultrasound and a biopsy confirming papillary thyroid cancer. Since the biopsy, she has had mild difficulty swallowing and weakness in her voice but is otherwise without complaints. She has no previous history of malignancy.  Past Medical History   Diagnosis  Date   .  Frequent headaches    .  Migraine    .  Cluster headache    .  Thyroid nodule  07/2013     right   .  Papillary thyroid carcinoma  09/03/2013   .  Allergic rhinitis     Past Surgical History   Procedure  Laterality  Date   .  Tonsillectomy and adenoidectomy   1976   .  Partial hysterectomy   2010     heavy bleeding, ovaries remain   .  Mri   2010     brain WNL per patient   .  Tympanostomy   child   .  Abdominal hysterectomy      Family History   Problem  Relation  Age of Onset   .  Hypertension  Father    .  Cancer  Paternal Grandfather  22     colon   .  Cancer  Maternal Grandfather      bone?   .  Stroke  Neg Hx    .  Diabetes  Neg Hx    .  CAD  Neg Hx    Social History  History   Substance Use Topics   .  Smoking status:  Never Smoker   .  Smokeless tobacco:  Never Used   .  Alcohol Use:  No   No Known Allergies  Current Outpatient Prescriptions   Medication  Sig  Dispense  Refill   .  Biotin 5000 MCG CAPS  Take 10,000 mcg by mouth.     .  cholecalciferol (VITAMIN D) 1000 UNITS tablet  Take 1,000 Units by mouth daily.     .  Garlic 4193 MG CAPS  Take 1 capsule by mouth.     .  Ginkgo Biloba 40 MG TABS  Take 120 mg by mouth.     .  magnesium oxide (MAG-OX) 400 MG tablet  Take 800 mg by mouth daily.     .  Multiple  Vitamin (MULTIVITAMIN) tablet  Take 1 tablet by mouth daily.     .  OMEGA-3 KRILL OIL PO  Take 1 capsule by mouth.      No current facility-administered medications for this visit.   Review of Systems  Review of Systems  Constitutional: Negative for fever, chills and unexpected weight change.  HENT: Positive for voice change. Negative for congestion, hearing loss, sore throat and trouble swallowing.  Eyes: Negative for visual disturbance.  Respiratory: Positive for cough. Negative for wheezing.  Cardiovascular: Positive for palpitations. Negative for chest pain and leg swelling.  Gastrointestinal: Negative for nausea, vomiting, abdominal pain, diarrhea, constipation, blood in stool, abdominal distention and anal bleeding.  Genitourinary: Negative for hematuria, vaginal bleeding and difficulty urinating.  Musculoskeletal: Negative for arthralgias.  Skin: Negative for  rash and wound.  Neurological: Positive for headaches. Negative for seizures and syncope.  Hematological: Negative for adenopathy. Does not bruise/bleed easily.  Psychiatric/Behavioral: Negative for confusion.  Blood pressure 121/78, pulse 72, temperature 97.7 F (36.5 C), temperature source Oral, resp. rate 18, height 5\' 7"  (1.702 m), weight 132 lb (59.875 kg).  Physical Exam  Physical Exam  Constitutional: She is oriented to person, place, and time. She appears well-developed and well-nourished. No distress.  HENT:  Head: Normocephalic and atraumatic.  Right Ear: External ear normal.  Left Ear: External ear normal.  Nose: Nose normal.  Mouth/Throat: Oropharynx is clear and moist. No oropharyngeal exudate.  Eyes: Conjunctivae are normal. Pupils are equal, round, and reactive to light. Right eye exhibits no discharge. Left eye exhibits no discharge. No scleral icterus.  Neck: Normal range of motion. Neck supple.  There is a palpable right thyroid nodule  Cardiovascular: Normal rate, regular rhythm, normal heart sounds and  intact distal pulses.  No murmur heard.  Pulmonary/Chest: Effort normal and breath sounds normal. No respiratory distress. She has no wheezes. She has no rales.  Abdominal: Soft. Bowel sounds are normal. She exhibits no distension. There is no tenderness. There is no rebound.  Musculoskeletal: Normal range of motion. She exhibits no edema and no tenderness.  Lymphadenopathy:  She has no cervical adenopathy.  Neurological: She is alert and oriented to person, place, and time.  Skin: Skin is warm and dry. No rash noted. She is not diaphoretic. No erythema.  Psychiatric: Her behavior is normal. Judgment normal.  Data Reviewed  I reviewed the ultrasound showing multiple nodules on the right thyroid lobe with no nodules present on the left side. The fine-needle aspiration showed papillary thyroid cancer  Assessment  Papillary thyroid cancer  Plan  I discussed the diagnosis with the patient and her husband in detail. They have already done a lot of research on the matter themselves. We discussed subtotal thyroidectomy versus right thyroid lobectomy. Most would recommend subtotal thyroidectomy. Recent studies have shown that all small nodules with papillary cancer that a right thyroid lobectomy is reasonable with a 10-20% chance of recurrence in the upper lobe which does not change long-term survival. Given her overall symptoms, I would recommend the subtotal thyroidectomy which she is in agreement with. I discussed the risks of surgery which includes but is not limited to bleeding, infection, injury to the recurrent laryngeal nerves, permanent hoarseness, injury to the parathyroid glands with need for permanent replacement of calcium. She also understands she will need to go on parathyroid hormone replacement postoperatively. Subtotal thyroidectomy will be scheduled

## 2013-10-09 ENCOUNTER — Encounter (HOSPITAL_COMMUNITY): Payer: Self-pay | Admitting: *Deleted

## 2013-10-09 ENCOUNTER — Ambulatory Visit (HOSPITAL_COMMUNITY): Admitting: Certified Registered Nurse Anesthetist

## 2013-10-09 ENCOUNTER — Observation Stay (HOSPITAL_COMMUNITY)
Admission: RE | Admit: 2013-10-09 | Discharge: 2013-10-10 | Disposition: A | Discharge status: Home / Self Care | Source: Ambulatory Visit | Attending: Surgery | Admitting: Surgery

## 2013-10-09 ENCOUNTER — Encounter (HOSPITAL_COMMUNITY): Admitting: Certified Registered Nurse Anesthetist

## 2013-10-09 ENCOUNTER — Encounter (HOSPITAL_COMMUNITY)
Admission: RE | Disposition: A | Discharge status: Home / Self Care | Payer: Self-pay | Source: Ambulatory Visit | Attending: Surgery

## 2013-10-09 DIAGNOSIS — C770 Secondary and unspecified malignant neoplasm of lymph nodes of head, face and neck: Secondary | ICD-10-CM | POA: Insufficient documentation

## 2013-10-09 DIAGNOSIS — C73 Malignant neoplasm of thyroid gland: Secondary | ICD-10-CM

## 2013-10-09 HISTORY — PX: THYROIDECTOMY, PARTIAL: SHX18

## 2013-10-09 HISTORY — PX: THYROIDECTOMY: SHX17

## 2013-10-09 SURGERY — THYROIDECTOMY
Anesthesia: General | Site: Neck

## 2013-10-09 MED ORDER — HYDROMORPHONE HCL PF 1 MG/ML IJ SOLN
INTRAMUSCULAR | Status: AC
  Filled 2013-10-09: qty 1

## 2013-10-09 MED ORDER — OXYCODONE HCL 5 MG/5ML PO SOLN: 5.0000 mg | Freq: Once | ORAL | Status: DC | PRN

## 2013-10-09 MED ORDER — NEOSTIGMINE METHYLSULFATE 10 MG/10ML IV SOLN
INTRAVENOUS | Status: DC | PRN
  Administered 2013-10-09: 3 mg via INTRAVENOUS

## 2013-10-09 MED ORDER — ENOXAPARIN SODIUM 40 MG/0.4ML ~~LOC~~ SOLN
40.0000 mg | SUBCUTANEOUS | Status: DC
  Filled 2013-10-09 (×2): qty 0.4

## 2013-10-09 MED ORDER — HYDROMORPHONE HCL PF 1 MG/ML IJ SOLN
0.2500 mg | INTRAMUSCULAR | Status: DC | PRN
  Administered 2013-10-09: 0.25 mg via INTRAVENOUS

## 2013-10-09 MED ORDER — GLYCOPYRROLATE 0.2 MG/ML IJ SOLN
INTRAMUSCULAR | Status: DC | PRN
  Administered 2013-10-09: .4 mg via INTRAVENOUS

## 2013-10-09 MED ORDER — BUPIVACAINE-EPINEPHRINE 0.25% -1:200000 IJ SOLN
INTRAMUSCULAR | Status: DC | PRN
  Administered 2013-10-09: 30 mL

## 2013-10-09 MED ORDER — BUPIVACAINE HCL (PF) 0.25 % IJ SOLN
INTRAMUSCULAR | Status: AC
  Filled 2013-10-09: qty 30

## 2013-10-09 MED ORDER — ACETAMINOPHEN 325 MG PO TABS
650.0000 mg | ORAL_TABLET | ORAL | Status: DC | PRN
  Administered 2013-10-09 – 2013-10-10 (×4): 650 mg via ORAL
  Filled 2013-10-09 (×4): qty 2

## 2013-10-09 MED ORDER — PROPOFOL 10 MG/ML IV BOLUS
INTRAVENOUS | Status: AC
  Filled 2013-10-09: qty 20

## 2013-10-09 MED ORDER — LIDOCAINE HCL (CARDIAC) 20 MG/ML IV SOLN
INTRAVENOUS | Status: AC
  Filled 2013-10-09: qty 5

## 2013-10-09 MED ORDER — POTASSIUM CHLORIDE IN NACL 20-0.9 MEQ/L-% IV SOLN
INTRAVENOUS | Status: DC
  Administered 2013-10-09 – 2013-10-10 (×2): via INTRAVENOUS
  Filled 2013-10-09 (×2): qty 1000

## 2013-10-09 MED ORDER — MORPHINE SULFATE 2 MG/ML IJ SOLN: 1.0000 mg | INTRAMUSCULAR | Status: DC | PRN

## 2013-10-09 MED ORDER — ONDANSETRON HCL 4 MG/2ML IJ SOLN: 4.0000 mg | Freq: Four times a day (QID) | INTRAMUSCULAR | Status: DC | PRN

## 2013-10-09 MED ORDER — FENTANYL CITRATE 0.05 MG/ML IJ SOLN
INTRAMUSCULAR | Status: DC | PRN
  Administered 2013-10-09 (×7): 50 ug via INTRAVENOUS

## 2013-10-09 MED ORDER — 0.9 % SODIUM CHLORIDE (POUR BTL) OPTIME
TOPICAL | Status: DC | PRN
  Administered 2013-10-09: 1000 mL

## 2013-10-09 MED ORDER — ONDANSETRON HCL 4 MG PO TABS: 4.0000 mg | ORAL_TABLET | Freq: Four times a day (QID) | ORAL | Status: DC | PRN

## 2013-10-09 MED ORDER — LACTATED RINGERS IV SOLN
INTRAVENOUS | Status: DC | PRN
  Administered 2013-10-09 (×2): via INTRAVENOUS

## 2013-10-09 MED ORDER — BUPIVACAINE-EPINEPHRINE (PF) 0.25% -1:200000 IJ SOLN
INTRAMUSCULAR | Status: AC
  Filled 2013-10-09: qty 30

## 2013-10-09 MED ORDER — HYDROCODONE-ACETAMINOPHEN 5-325 MG PO TABS
1.0000 | ORAL_TABLET | ORAL | Status: DC | PRN
  Filled 2013-10-09: qty 1

## 2013-10-09 MED ORDER — FENTANYL CITRATE 0.05 MG/ML IJ SOLN
INTRAMUSCULAR | Status: AC
  Filled 2013-10-09: qty 5

## 2013-10-09 MED ORDER — PROPOFOL 10 MG/ML IV BOLUS
INTRAVENOUS | Status: DC | PRN
  Administered 2013-10-09: 160 mg via INTRAVENOUS

## 2013-10-09 MED ORDER — MIDAZOLAM HCL 5 MG/5ML IJ SOLN
INTRAMUSCULAR | Status: DC | PRN
  Administered 2013-10-09: 2 mg via INTRAVENOUS

## 2013-10-09 MED ORDER — OXYCODONE HCL 5 MG PO TABS: 5.0000 mg | ORAL_TABLET | Freq: Once | ORAL | Status: DC | PRN

## 2013-10-09 MED ORDER — HEMOSTATIC AGENTS (NO CHARGE) OPTIME
TOPICAL | Status: DC | PRN
  Administered 2013-10-09: 1 via TOPICAL

## 2013-10-09 MED ORDER — ROCURONIUM BROMIDE 50 MG/5ML IV SOLN
INTRAVENOUS | Status: AC
  Filled 2013-10-09: qty 1

## 2013-10-09 MED ORDER — MIDAZOLAM HCL 2 MG/2ML IJ SOLN
INTRAMUSCULAR | Status: AC
  Filled 2013-10-09: qty 2

## 2013-10-09 MED ORDER — ONDANSETRON HCL 4 MG/2ML IJ SOLN
INTRAMUSCULAR | Status: DC | PRN
  Administered 2013-10-09: 4 mg via INTRAVENOUS

## 2013-10-09 MED ORDER — LIDOCAINE HCL (CARDIAC) 20 MG/ML IV SOLN
INTRAVENOUS | Status: DC | PRN
  Administered 2013-10-09: 100 mg via INTRAVENOUS

## 2013-10-09 MED ORDER — ROCURONIUM BROMIDE 100 MG/10ML IV SOLN
INTRAVENOUS | Status: DC | PRN
  Administered 2013-10-09: 50 mg via INTRAVENOUS

## 2013-10-09 MED ORDER — LIDOCAINE HCL 4 % MT SOLN
OROMUCOSAL | Status: DC | PRN
  Administered 2013-10-09: 4 mL via TOPICAL

## 2013-10-09 MED ORDER — ONDANSETRON HCL 4 MG/2ML IJ SOLN
INTRAMUSCULAR | Status: AC
  Filled 2013-10-09: qty 2

## 2013-10-09 SURGICAL SUPPLY — 59 items
BENZOIN TINCTURE PRP APPL 2/3 (GAUZE/BANDAGES/DRESSINGS) ×3 IMPLANT
BLADE SURG 15 STRL LF DISP TIS (BLADE) ×1 IMPLANT
BLADE SURG 15 STRL SS (BLADE) ×2
CANISTER SUCTION 2500CC (MISCELLANEOUS) ×3 IMPLANT
CHLORAPREP W/TINT 10.5 ML (MISCELLANEOUS) ×3 IMPLANT
CLIP TI MEDIUM 24 (CLIP) ×3 IMPLANT
CLIP TI WIDE RED SMALL 24 (CLIP) ×3 IMPLANT
CLOSURE WOUND 1/2 X4 (GAUZE/BANDAGES/DRESSINGS) ×1
CONT SPEC 4OZ CLIKSEAL STRL BL (MISCELLANEOUS) ×3 IMPLANT
COVER SURGICAL LIGHT HANDLE (MISCELLANEOUS) ×3 IMPLANT
CRADLE DONUT ADULT HEAD (MISCELLANEOUS) ×3 IMPLANT
DRAPE PED LAPAROTOMY (DRAPES) ×3 IMPLANT
DRAPE UTILITY 15X26 W/TAPE STR (DRAPE) ×6 IMPLANT
ELECT CAUTERY BLADE 6.4 (BLADE) ×3 IMPLANT
ELECT REM PT RETURN 9FT ADLT (ELECTROSURGICAL) ×3
ELECTRODE REM PT RTRN 9FT ADLT (ELECTROSURGICAL) ×1 IMPLANT
GAUZE SPONGE 4X4 16PLY XRAY LF (GAUZE/BANDAGES/DRESSINGS) ×3 IMPLANT
GLOVE BIO SURGEON STRL SZ 6.5 (GLOVE) ×2 IMPLANT
GLOVE BIO SURGEON STRL SZ7 (GLOVE) ×6 IMPLANT
GLOVE BIO SURGEONS STRL SZ 6.5 (GLOVE) ×1
GLOVE BIOGEL PI IND STRL 6.5 (GLOVE) ×1 IMPLANT
GLOVE BIOGEL PI IND STRL 7.0 (GLOVE) ×1 IMPLANT
GLOVE BIOGEL PI IND STRL 7.5 (GLOVE) ×1 IMPLANT
GLOVE BIOGEL PI INDICATOR 6.5 (GLOVE) ×2
GLOVE BIOGEL PI INDICATOR 7.0 (GLOVE) ×2
GLOVE BIOGEL PI INDICATOR 7.5 (GLOVE) ×2
GLOVE SS N UNI LF 7.0 STRL (GLOVE) ×3 IMPLANT
GLOVE SURG SIGNA 7.5 PF LTX (GLOVE) ×3 IMPLANT
GOWN STRL REUS W/ TWL LRG LVL3 (GOWN DISPOSABLE) ×3 IMPLANT
GOWN STRL REUS W/ TWL XL LVL3 (GOWN DISPOSABLE) ×1 IMPLANT
GOWN STRL REUS W/TWL LRG LVL3 (GOWN DISPOSABLE) ×6
GOWN STRL REUS W/TWL XL LVL3 (GOWN DISPOSABLE) ×2
HEMOSTAT SURGICEL 2X4 FIBR (HEMOSTASIS) ×3 IMPLANT
KIT BASIN OR (CUSTOM PROCEDURE TRAY) ×3 IMPLANT
KIT ROOM TURNOVER OR (KITS) ×3 IMPLANT
NEEDLE HYPO 25GX1X1/2 BEV (NEEDLE) ×3 IMPLANT
NS IRRIG 1000ML POUR BTL (IV SOLUTION) ×3 IMPLANT
PACK SURGICAL SETUP 50X90 (CUSTOM PROCEDURE TRAY) ×3 IMPLANT
PAD ARMBOARD 7.5X6 YLW CONV (MISCELLANEOUS) ×3 IMPLANT
PENCIL BUTTON HOLSTER BLD 10FT (ELECTRODE) ×3 IMPLANT
SHEARS HARMONIC 9CM CVD (BLADE) ×3 IMPLANT
SPECIMEN JAR MEDIUM (MISCELLANEOUS) IMPLANT
SPECIMEN JAR SMALL (MISCELLANEOUS) ×3 IMPLANT
SPONGE GAUZE 4X4 12PLY (GAUZE/BANDAGES/DRESSINGS) IMPLANT
SPONGE INTESTINAL PEANUT (DISPOSABLE) ×6 IMPLANT
STRIP CLOSURE SKIN 1/2X4 (GAUZE/BANDAGES/DRESSINGS) ×2 IMPLANT
SUT MNCRL AB 4-0 PS2 18 (SUTURE) ×3 IMPLANT
SUT SILK 2 0 (SUTURE) ×2
SUT SILK 2-0 18XBRD TIE 12 (SUTURE) ×1 IMPLANT
SUT SILK 3 0 (SUTURE) ×2
SUT SILK 3-0 18XBRD TIE 12 (SUTURE) ×1 IMPLANT
SUT VIC AB 3-0 SH 18 (SUTURE) ×6 IMPLANT
SYR BULB 3OZ (MISCELLANEOUS) ×3 IMPLANT
SYR CONTROL 10ML LL (SYRINGE) ×3 IMPLANT
TOWEL OR 17X24 6PK STRL BLUE (TOWEL DISPOSABLE) ×3 IMPLANT
TOWEL OR 17X26 10 PK STRL BLUE (TOWEL DISPOSABLE) ×3 IMPLANT
TUBE CONNECTING 12'X1/4 (SUCTIONS) ×1
TUBE CONNECTING 12X1/4 (SUCTIONS) ×2 IMPLANT
WATER STERILE IRR 1000ML POUR (IV SOLUTION) IMPLANT

## 2013-10-09 NOTE — Interval H&P Note (Signed)
History and Physical Interval Note: no change in H and P  10/09/2013 6:59 AM  Maria Ray  has presented today for surgery, with the diagnosis of thyroid cancer  The various methods of treatment have been discussed with the patient and family. After consideration of risks, benefits and other options for treatment, the patient has consented to  Procedure(s): THYROIDECTOMY SUBTOTAL (N/A) as a surgical intervention .  The patient's history has been reviewed, patient examined, no change in status, stable for surgery.  I have reviewed the patient's chart and labs.  Questions were answered to the patient's satisfaction.     Harl Bowie

## 2013-10-09 NOTE — Anesthesia Preprocedure Evaluation (Addendum)
Anesthesia Evaluation  Patient identified by MRN, date of birth, ID band Patient awake    Reviewed: Allergy & Precautions, H&P , NPO status , Patient's Chart, lab work & pertinent test results  Airway Mallampati: II  Neck ROM: full    Dental   Pulmonary          Cardiovascular negative cardio ROS      Neuro/Psych  Headaches,    GI/Hepatic   Endo/Other  Papillary thyroid CA  Renal/GU      Musculoskeletal   Abdominal   Peds  Hematology   Anesthesia Other Findings   Reproductive/Obstetrics                           Anesthesia Physical Anesthesia Plan  ASA: II  Anesthesia Plan: General   Post-op Pain Management:    Induction: Intravenous  Airway Management Planned: Oral ETT  Additional Equipment:   Intra-op Plan:   Post-operative Plan: Extubation in OR  Informed Consent: I have reviewed the patients History and Physical, chart, labs and discussed the procedure including the risks, benefits and alternatives for the proposed anesthesia with the patient or authorized representative who has indicated his/her understanding and acceptance.   Dental advisory given  Plan Discussed with: CRNA, Anesthesiologist and Surgeon  Anesthesia Plan Comments:        Anesthesia Quick Evaluation

## 2013-10-09 NOTE — Anesthesia Postprocedure Evaluation (Signed)
Anesthesia Post Note  Patient: Maria Ray  Procedure(s) Performed: Procedure(s) (LRB): THYROIDECTOMY SUBTOTAL (N/A)  Anesthesia type: General  Patient location: PACU  Post pain: Pain level controlled and Adequate analgesia  Post assessment: Post-op Vital signs reviewed, Patient's Cardiovascular Status Stable, Respiratory Function Stable, Patent Airway and Pain level controlled  Last Vitals:  Filed Vitals:   10/09/13 0938  BP: 131/84  Pulse: 60  Temp:   Resp: 17    Post vital signs: Reviewed and stable  Level of consciousness: awake, alert  and oriented  Complications: No apparent anesthesia complications

## 2013-10-09 NOTE — Transfer of Care (Signed)
Immediate Anesthesia Transfer of Care Note  Patient: Maria Ray  Procedure(s) Performed: Procedure(s): THYROIDECTOMY SUBTOTAL (N/A)  Patient Location: PACU  Anesthesia Type:General  Level of Consciousness: awake, alert  and oriented  Airway & Oxygen Therapy: Patient Spontanous Breathing  Post-op Assessment: Report given to PACU RN  Post vital signs: Reviewed and stable  Complications: No apparent anesthesia complications

## 2013-10-09 NOTE — Op Note (Signed)
THYROIDECTOMY SUBTOTAL  Procedure Note  RYA RAUSCH 10/09/2013   Pre-op Diagnosis: papillary thyroid cancer     Post-op Diagnosis: same  Procedure(s): TOTAL THYROIDECTOMY   Surgeon(s): Imogene Burn. Georgette Dover, MD Harl Bowie, MD  Anesthesia: General  Staff:  Circulator: Beryle Lathe, RN Relief Scrub: Cherene Julian, RN Scrub Person: Leslie Andrea, CST Circulator Assistant: Cherene Julian, RN  Estimated Blood Loss: Minimal               Specimens: SENT TO PATH          Harl Bowie   Date: 10/09/2013  Time: 8:54 AM

## 2013-10-10 LAB — BASIC METABOLIC PANEL
BUN: 8 mg/dL (ref 6–23)
CO2: 26 meq/L (ref 19–32)
CREATININE: 0.87 mg/dL (ref 0.50–1.10)
Calcium: 8.5 mg/dL (ref 8.4–10.5)
Chloride: 104 mEq/L (ref 96–112)
GFR calc non Af Amer: 80 mL/min — ABNORMAL LOW (ref >90)
Glucose, Bld: 95 mg/dL (ref 70–99)
POTASSIUM: 4.3 meq/L (ref 3.7–5.3)
SODIUM: 141 meq/L (ref 137–147)

## 2013-10-10 NOTE — Progress Notes (Signed)
Patient ID: Maria Ray, female   DOB: 1970-07-21, 43 y.o.   MRN: [id number]  Doing well Voice a little hoarse No hematoma Ca+ level is normal  Discharge home

## 2013-10-10 NOTE — Discharge Instructions (Signed)
OK to shower.  Leave steri-strips on at least one week.  Advil, tylenol, Excedrin for pain  I will call you with the final pathology

## 2013-10-10 NOTE — Progress Notes (Signed)
Maria Ray to be D/C'd Home per MD order.  Discussed with the patient and all questions fully answered.    Medication List         EXCEDRIN BACK & BODY 250-250 MG tablet  Generic drug:  Acetaminophen-Aspirin Buffered  Take 1 tablet by mouth daily as needed (for headaches).        VSS, Skin clean, dry and intact without evidence of skin break down, no evidence of skin tears noted. Incision to neck is clean, dry, intact with steri strips in place.  IV catheter discontinued intact. Site without signs and symptoms of complications. Dressing and pressure applied. Patient voided prior to discharge per MD order.  An After Visit Summary was printed and given to the patient.  D/c education completed with patient/family including follow up instructions, medication list, d/c activities limitations if indicated, with other d/c instructions as indicated by MD - patient able to verbalize understanding, all questions fully answered.   Patient instructed to return to ED, call 911, or call MD for any changes in condition.   Patient escorted via [place], and D/C home via private auto.  Micki Riley 10/10/2013 7:52 AM

## 2013-10-10 NOTE — Discharge Summary (Signed)
Physician Discharge Summary  Patient ID: Maria Ray MRN: [id number] DOB/AGE: 44-May-1972 44 y.o.  Admit date: 10/09/2013 Discharge date: 10/10/2013  Admission Diagnoses:  Discharge Diagnoses:  Active Problems:   Thyroid cancer   Discharged Condition: good  Hospital Course: uneventful post op recovery  Consults: None  Significant Diagnostic Studies:   Treatments: surgery: total thyroidectomy  Discharge Exam: Blood pressure 117/75, pulse 61, temperature 98.4 F (36.9 C), temperature source Oral, resp. rate 17, height 5\' 7"  (1.702 m), weight 148 lb (67.132 kg), SpO2 97.00%. General appearance: alert, cooperative and no distress Incision/Wound: no hematoma, voice mildly hoarse  Disposition: Final discharge disposition not confirmed   Future Appointments Provider Department Dept Phone   10/23/2013 10:10 AM Harl Bowie, MD [facility], [place] [phone]       Medication List         EXCEDRIN BACK & BODY 250-250 MG tablet  Generic drug:  Acetaminophen-Aspirin Buffered  Take 1 tablet by mouth daily as needed (for headaches).           Follow-up Information   Follow up with [facility] A, MD. Schedule an appointment as soon as possible for a visit in 2 weeks.   Specialty:  General Surgery   Contact information:   [address] [place] [address] [phone]       Signed: Harl Bowie 10/10/2013, 7:14 AM

## 2013-10-10 NOTE — Op Note (Signed)
Maria Ray, Maria Ray NO.:  [id number]  MEDICAL RECORD NO.:  [id number]  LOCATION:  [id number]                        FACILITY:  [facility]  PHYSICIAN:  Coralie Keens, M.D. DATE OF BIRTH:  1970-07-27  DATE OF PROCEDURE:  10/09/2013 DATE OF DISCHARGE:                              OPERATIVE REPORT   PREOPERATIVE DIAGNOSIS:  Papillary thyroid cancer.  POSTOPERATIVE DIAGNOSIS:  Papillary thyroid cancer.  PROCEDURE:  Total thyroidectomy.  SURGEON:  Coralie Keens, M.D.  ASSISTANT:  Dr. Patria Mane.  ANESTHESIA:  General endotracheal anesthesia.  ESTIMATED BLOOD LOSS:  Minimal.  INDICATIONS:  Maria Ray is a 43 year old female, who was found to have a thyroid nodule on examination.  Ultrasound confirmed multiple nodules on the right side of the thyroid gland.  Fine-needle aspiration confirmed a papillary thyroid cancer.  After discussion, decision was made to proceed with total thyroidectomy.  FINDINGS:  The patient was indeed found to have multiple nodules in the right thyroid gland.  There was 1 enlarged lymph node in the lateral compartment, which was excised.  PROCEDURE IN DETAIL:  The patient was brought to the operating room, identified as Maria Ray.  She was placed supine on the operating room table.  General anesthesia was induced.  Her neck was then prepped and draped in usual sterile fashion.  A transverse incision was made across the lower neck with a scalpel.  This was taken down through the platysma with electrocautery.  Superior and inferior skin flaps were then created with the cautery.  The midline was then identified and opened.  The strap muscles were then retracted towards the right and thyroid gland was identified.  I dissected the strap muscles off the gland and then was able to elevate the gland.  The superior pole vessels were taken down with clips and the Harmonic Scalpel.  The inferior pole vessels were likewise taken down with  surgical clips and Harmonic Scalpel as well.  Inferior parathyroid gland was identified and separated.  I then turned my attention towards the central portion of the thyroid gland.  I was able to identify the recurrent laryngeal nerve and dissect the thyroid gland off the vessels.  The patient had multiple nodules, which one was quite from the central portion of the right lobe of the gland. Again, the recurrent laryngeal nerve was identified and kept separate from the dissection.  I was then able to complete the dissection of the right thyroid gland taking down the middle thyroid vein and thyroid artery.  The gland was then elevated up along the trachea.  The esophagus was easily visualized as well.  I then identified one enlarged lymph node in the lower portion of the neck just inferior and lateral to the lower pole.  I was able to easily separate this lymph node out with Harmonic Scalpel and sent it to pathology for evaluation.  I then turned my attention to the left gland.  I again dissected the strap muscles laterally with both blunt dissection and electrocautery.  I then identified the gland and was able to use Kittner to get the rest of the strap muscles off of the gland.  I then again  took down the superior pole vessels with surgical clips with the Harmonic Scalpel.  Likewise a large vein to the inferior pole was identified and clipped with surgical clips and transected as well.  I then identified the recurrent laryngeal nerve on the left.  Identified also what appeared to be a superior and inferior parathyroid gland.  I kept these separated easily from the thyroid gland itself.  I then dissected the gland completely and then the medial portion took down the middle thyroid vein and the thyroid artery.  The small vein running along the course of the recurrent laryngeal nerve had to be clipped with surgical clips.  This appeared to be quite separate from the nerve itself.  I then  completed the dissection brain the thyroid gland up off of the trachea and came to the removal of the isthmus and thyroid gland.  This completed the removal of the entire specimen.  I marked the superior right pole with a surgical suture and then sent the gland to pathology for evaluation.  I then irrigated both sides of the neck with saline.  Hemostasis appeared to be achieved.  I placed several pieces of FiberWire in the neck.  I then reapproximated the patient's midline with interrupted 3-0 Vicryl pop-off sutures.  I then anesthetized the skin and subcutaneous tissue with Marcaine.  I reapproximated the platysma with interrupted 3-0 Vicryl sutures and closed the skin with a running 4-0 Monocryl.  Steri-Strips, gauze, and tape were then applied.  The patient tolerated the procedure well.  All counts were correct at the end of procedure.  The patient was then extubated in operating room and taken to stable condition to recovery.     Coralie Keens, M.D.     DB/MEDQ  D:  10/09/2013  T:  10/10/2013  Job:  [id number]

## 2013-10-11 ENCOUNTER — Other Ambulatory Visit (INDEPENDENT_AMBULATORY_CARE_PROVIDER_SITE_OTHER): Payer: Self-pay | Admitting: Surgery

## 2013-10-11 MED ORDER — LEVOTHYROXINE SODIUM 100 MCG PO TABS: 100.0000 ug | ORAL_TABLET | Freq: Every day | ORAL | Status: DC

## 2013-10-12 ENCOUNTER — Ambulatory Visit (INDEPENDENT_AMBULATORY_CARE_PROVIDER_SITE_OTHER): Admitting: Family Medicine

## 2013-10-12 ENCOUNTER — Encounter: Payer: Self-pay | Admitting: Family Medicine

## 2013-10-12 VITALS — BP 122/64 | HR 72 | Temp 97.5°F | Wt 134.5 lb

## 2013-10-12 DIAGNOSIS — M542 Cervicalgia: Secondary | ICD-10-CM

## 2013-10-12 DIAGNOSIS — C73 Malignant neoplasm of thyroid gland: Secondary | ICD-10-CM

## 2013-10-12 MED ORDER — CYCLOBENZAPRINE HCL 5 MG PO TABS: 5.0000 mg | ORAL_TABLET | Freq: Three times a day (TID) | ORAL | Status: DC | PRN

## 2013-10-12 NOTE — Progress Notes (Signed)
Pre visit review using our clinic review tool, if applicable. No additional management support is needed unless otherwise documented below in the visit note. 

## 2013-10-12 NOTE — Progress Notes (Signed)
BP 122/64  Pulse 72  Temp(Src) 97.5 F (36.4 C) (Oral)  Wt 134 lb 8 oz (61.009 kg)   CC: discuss endo referral   Subjective:    Patient ID: Maria Ray, female    DOB: 03-31-1971, 43 y.o.   MRN: [id number]  HPI: Maria Ray is a 43 y.o. female presenting on 10/12/2013 for Referral   See recent OV notes and recent hospital records.  [place], found to have papillary thyroid carcinoma s/p total thyroidectomy this week with LN biopsy x1.  Started on levothyroxine 113mcg daily on 10/11/2013.  Has f/u planned 10/23/2013.  Presents today to discuss f/u plan.  Ongoing left neck pain for last several months - along with some swelling noted.  Tender to touch and worse with neck movements.  Actually since surgery some improved.  Path report:  1. Thyroid, thyroidectomy - PAPILLARY THYROID CARCINOMA, MULTIPLE FOCI, THE LARGEST SPANS 2.5 CM, CONFINED TO THE THYROID. - LYMPHOVASCULAR INVASION IS IDENTIFIED. - ADENOCARCINOMA IS BROADLY LESS THAN 0.1 MM TO THE INKED MARGIN OF THE RIGHT MIDDLE LOBE. - PARATHYROID TISSUE (NEAR RIGHT LOWER LOBE). - SEE ONCOLOGY TABLE BELOW. 2. Lymph node, biopsy, Right lateral compartmental - METASTATIC PAPILLARY THYROID CARCINOMA IN 1 OF 1 LYMPH NODE (1/1).  Relevant past medical, surgical, family and social history reviewed and updated as indicated.  Allergies and medications reviewed and updated. Current Outpatient Prescriptions on File Prior to Visit  Medication Sig  . levothyroxine (SYNTHROID) 100 MCG tablet Take 1 tablet (100 mcg total) by mouth daily before breakfast.   No current facility-administered medications on file prior to visit.    Review of Systems Per HPI unless specifically indicated above    Objective:    BP 122/64  Pulse 72  Temp(Src) 97.5 F (36.4 C) (Oral)  Wt 134 lb 8 oz (61.009 kg)  Physical Exam  Nursing note and vitals reviewed. Constitutional: She appears well-developed and well-nourished. No distress.  Neck:  Incision at  neck c/d/i   Musculoskeletal:  Tender to palpation and swelling noted at left trapezius muscle.   Results for orders placed during the hospital encounter of 85/27/78  BASIC METABOLIC PANEL      Result Value Ref Range   Sodium 141  137 - 147 mEq/L   Potassium 4.3  3.7 - 5.3 mEq/L   Chloride 104  96 - 112 mEq/L   CO2 26  19 - 32 mEq/L   Glucose, Bld 95  70 - 99 mg/dL   BUN 8  6 - 23 mg/dL   Creatinine, Ser 0.87  0.50 - 1.10 mg/dL   Calcium 8.5  8.4 - 10.5 mg/dL   GFR calc non Af Amer 80 (*) >90 mL/min   GFR calc Af Amer >90  >90 mL/min      Assessment & Plan:   Problem List Items Addressed This Visit   Papillary thyroid carcinoma - Primary     TNM code: mpT2, pN1 Discussed recent surgery and pathology results. Questions answered, discussed anticipated plan of treatment. Will refer to endo for further treatment.  Pt agrees.    Relevant Medications      Aspirin-Acetaminophen-Caffeine (EXCEDRIN PO)   Other Relevant Orders      Ambulatory referral to Endocrinology   Cervical pain (neck)     Anticipate trapezius sprain - treat with heating pad and flexeril low dose.  If persistent, will need further eval in setting of recent thyroid cancer.        Follow up  plan: Return if symptoms worsen or fail to improve.

## 2013-10-12 NOTE — Assessment & Plan Note (Signed)
Anticipate trapezius sprain - treat with heating pad and flexeril low dose.  If persistent, will need further eval in setting of recent thyroid cancer.

## 2013-10-12 NOTE — Assessment & Plan Note (Signed)
TNM code: mpT2, pN1 Discussed recent surgery and pathology results. Questions answered, discussed anticipated plan of treatment. Will refer to endo for further treatment.  Pt agrees.

## 2013-10-12 NOTE — Patient Instructions (Addendum)
Good to see you Pass by Maria Ray's office for referral to endocrinologist. Anticipated return to work May 26, may keep you out until June 1st depending on how you're feeling.

## 2013-10-19 ENCOUNTER — Encounter: Payer: Self-pay | Admitting: Internal Medicine

## 2013-10-19 ENCOUNTER — Ambulatory Visit (INDEPENDENT_AMBULATORY_CARE_PROVIDER_SITE_OTHER): Admitting: Internal Medicine

## 2013-10-19 VITALS — BP 108/72 | HR 90 | Temp 98.3°F | Resp 12 | Ht 67.0 in | Wt 131.0 lb

## 2013-10-19 DIAGNOSIS — C73 Malignant neoplasm of thyroid gland: Secondary | ICD-10-CM

## 2013-10-19 DIAGNOSIS — E890 Postprocedural hypothyroidism: Secondary | ICD-10-CM

## 2013-10-19 NOTE — Progress Notes (Signed)
Patient ID: Maria Ray, female   DOB: July 31, 1970, 43 y.o.   MRN: [id number]   HPI  Maria Ray is a 43 y.o.-year-old female, referred by her PCP, Dr. Danise Mina, for management of papillary thyroid cancer and postsurgical hypothyroidism.  She started to feel very tired, cluster HAs, SOB x 2, hoarseness for 2.5 years. She then saw Dr Danise Mina on 07/2013 >> nodule palpated >> sent for thyroid U/S >> MNG >> the dominant nodule was Bx'd >> Thy CA >> sent for surgery (Dr Coralie Keens) >> 10/09/2013 >> PTC - 3 foci, metastatic in 1/1 LN.   ThyCa history: - 08/27/2013: Thyroid U/S [facility]):   Right lobe: 0.9 x 0.9 x 1 cm hypoechoic solid-appearing nodule with calcifications in upper pole. Solid-appearing nodule with calcifications measuring 2.4 x 1.5 x 2 cm mid pole. 0.7 x 0.5 x 0.4 cm complex appearing nodule in the lower pole  Left lobes: No nodules visualized  Isthmus: No nodules visualized  Lymphadenopathy: None visualized - 08/30/2013: Thyroid nodule biopsy [facility]): findings consistent with papillary carcinoma - 10/09/2013: Total thyroidectomy by Dr. Ninfa Linden - PAPILLARY THYROID CARCINOMA, MULTIPLE FOCI, THE LARGEST SPANS 2.5 CM, CONFINED TO THE THYROID. - LYMPHOVASCULAR INVASION IS IDENTIFIED. - ADENOCARCINOMA IS BROADLY LESS THAN 0.1 MM TO THE INKED MARGIN OF THE RIGHT MIDDLE LOBE. - PARATHYROID TISSUE (NEAR RIGHT LOWER LOBE). Lymph node, biopsy, Right lateral compartmental - METASTATIC PAPILLARY THYROID CARCINOMA IN 1 OF 1 LYMPH NODE (1/1). Tumor focality: Multiple foci (at least 4 grossly identified foci) Dominant tumor: Maximum tumor size (cm): 2.5 cm (gross measurement) Tumor laterality: Right lower lobe Histologic type: Papillary thyroid carcinoma Tumor capsule: Present Extrathyroidal extension: Not identified Margins: Negative for carcinoma Lymph - Vascular invasion: Present Capsular invasion with degree of invasion if present: Not identified Second largest tumor: Tumor  size(s): 1.0 cm (gross measurement) Tumor laterality: Right upper pole Histologic type : Papillary thyroid carcinoma Tumor capsule: Present Extrathyroidal extension: Not identified Margins: Negative for carcinoma Lymph - Vascular invasion: Present Capsular invasion with degree of invasion if present: Not identified Lymph nodes: # examined 1; # positive; 1 TNM code: mpT2, pN1 Non-neoplastic thyroid: Lymphocytic thyroiditis  Comments: There are at least four grossly identified foci of papillary thyroid carcinoma, the largest spanning 2.5 cm. There is associated lymphovascular invasion. The 0.6 cm focus of carcinoma in the right middle lobe is broadly less than 0.1 cm from the inked resection margin. - 10/11/2013: Started Levothyroxine 100 mcg   She takes Levothyroxine: - fasting - with water - separated by >30 min from b'fast  - no calcium, iron, PPIs, multivitamins   I reviewed pt's latest thyroid tests: Lab Results  Component Value Date   TSH 2.390 01/15/2013    Pt denies feeling nodules in neck, hoarseness, dysphagia/odynophagia, SOB with lying down.  Pt c/o: - + cold/heat intolerance - no weight gain - + fatigue - no constipation - no dry skin - no hair falling - no depression  She has no FH of thyroid disorders. No FH of thyroid cancer.  No h/o radiation tx to head or neck, but had a thyroid uptake and scan in 2010 >> normal.   ROS: Constitutional: no weight gain/loss, + fatigue, + both subjective hyperthermia/hypothermia, + poor sleep Eyes: no blurry vision, no xerophthalmia ENT: no sore throat, no nodules palpated in throat, no dysphagia/odynophagia, no hoarseness Cardiovascular: no CP/+SOB/no palpitations/leg swelling Respiratory: no cough/+SOB Gastrointestinal: no N/V/D/C Musculoskeletal: + muscle aches/no joint aches Skin: no rashes Neurological: no tremors/numbness/tingling/dizziness, +  HA Psychiatric: no depression/anxiety  Past Medical History   Diagnosis Date  . Thyroid nodule 07/2013    right  . Allergic rhinitis   . Shortness of breath     states it's related to the thyroid cancer  . Papillary thyroid carcinoma 09/03/2013  . Migraine     "maybe a couple times/yr" (10/09/2013)  . Cluster headache    Past Surgical History  Procedure Laterality Date  . Mri  2010    brain WNL per patient  . Tympanostomy  child  . Thyroidectomy, partial  10/09/2013    "really a total but called it subtotal cause he left 1 cell"  . Tonsillectomy and adenoidectomy  1976  . Carpal tunnel with cubital tunnel Bilateral 2004-2006  . Abdominal hysterectomy  2010    partial; heavy bleeding, ovaries remain  . Reduction mammaplasty  1990  . Thyroidectomy N/A 10/09/2013    Procedure: THYROIDECTOMY SUBTOTAL;  Surgeon: Harl Bowie, MD;  Location: [facility];  Service: General;  Laterality: N/A;   History   Social History Main Topics  . Smoking status: Never Smoker   . Smokeless tobacco: Never Used  . Alcohol Use: No  . 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 Aspirin-Acetaminophen-Caffeine (EXCEDRIN PO) Take 1 tablet by mouth as needed.      . cyclobenzaprine (FLEXERIL) 5 MG tablet Take 1 tablet (5 mg total) by mouth 3 (three) times daily as needed for muscle spasms (sedation precautions).  30 tablet  1  . levothyroxine (SYNTHROID) 100 MCG tablet Take 1 tablet (100 mcg total) by mouth daily before breakfast.  30 tablet  2   No current facility-administered medications on file prior to visit.   No Known Allergies Family History  Problem Relation Age of Onset  . Hypertension Father   . Cancer  Paternal Grandfather 64    colon  . Cancer Maternal Grandfather     bone?  . Stroke Neg Hx   . Diabetes Neg Hx   . CAD Neg Hx    PE: BP 108/72  Pulse 90  Temp(Src) 98.3 F (36.8 C) (Oral)  Resp 12  Ht 5' 7"  (1.702 m)  Wt 131 lb (59.421 kg)  BMI 20.51 kg/m2  SpO2 97% Wt Readings from Last 3 Encounters:  10/19/13 131 lb (59.421 kg)  10/12/13 134 lb 8 oz (61.009 kg)  10/09/13 138 lb (67.132 kg)   Constitutional: normal weight, in NAD Eyes: PERRLA, EOMI, no exophthalmos ENT: moist mucous membranes, cervical scar still swollen but w/o fluctuance or erythema, no cervical lymphadenopathy Cardiovascular: RRR, No MRG Respiratory: CTA B Gastrointestinal: abdomen soft, NT, ND, BS+ Musculoskeletal: no deformities, strength intact in all 4 Skin: moist, warm, no rashes Neurological: no tremor with outstretched hands, DTR normal in all 4  ASSESSMENT: 1. PTC (palpillary ThyCA) - stage 1, 2/2 age <44 y/o  2. Postsurgical Hypothyroidism  PLAN:  1. And 2. Patient with recent dx of PTC, s/p thyroidectomy - We discussed about correct intake of levothyroxine, fasting, with water, separated by at least 30 minutes from breakfast, and separated by more than 4 hours from calcium, iron, multivitamins, acid  reflux medications (PPIs). She is taking it correctly.  - Continue 100 mcg Levothyroxine >> we'll check thyroid tests in 1 mo: TSH, free T4 (ordered) - She does not appear to have pain or discomfort at the surgical site - applies Mederma - we discussed about RAI tx >> will need this mostly for remnant ablation and to facilitate f/u. Discussed plan for f/u. Given brochure about low iodine diet and about RAI tx. - I will order and arrange the RAI tx and the post tx scan - I will see her back 1 mo after the RAI tx  I called and discussed about the patient with Dr. Leonia Reeves in nuclear medicine >> he suggested to use 100 mCi of I-131 and no need to perform a pretreatment scan.  We'll try to arrange  for radioactive iodine treatment around July 21.  Pt was given for now the following instructions, but will give her the actual dates after scheduling: [facility]  Day 1: Stop Synthroid and start low iodine diet Day 18: come to our lab for a TSH level check - If this is mildly high, but <30, we might need to continue the low iodine diet and staying off the Synthroid for another week - if this is high (>30), we will go ahead with the following appointments: Day 21: go to [facility] for the RAI treatment Day 24: Restart Synthroid and stop low iodine diet Day 28: go to [facility] for the post-treatment Whole Body Scan 1 month later: come for an appointment with me

## 2013-10-19 NOTE — Patient Instructions (Signed)
Please return in 1 month for labs. I will schedule your RAI treatment and send you the schedule. Please read the RAI Treatment instructions - call with questions.

## 2013-10-23 ENCOUNTER — Ambulatory Visit (INDEPENDENT_AMBULATORY_CARE_PROVIDER_SITE_OTHER): Admitting: Surgery

## 2013-10-23 ENCOUNTER — Encounter (INDEPENDENT_AMBULATORY_CARE_PROVIDER_SITE_OTHER): Payer: Self-pay | Admitting: Surgery

## 2013-10-23 VITALS — BP 122/71 | HR 68 | Temp 98.6°F | Resp 16 | Ht 67.0 in | Wt 131.8 lb

## 2013-10-23 DIAGNOSIS — Z09 Encounter for follow-up examination after completed treatment for conditions other than malignant neoplasm: Secondary | ICD-10-CM

## 2013-10-23 NOTE — Progress Notes (Signed)
Subjective:     Patient ID: Maria Ray, female   DOB: September 26, 1970, 43 y.o.   MRN: [id number]  HPI She is here for her first postop visit status post total thyroidectomy for papillary thyroid cancer. She reports that she still has occasional hoarseness and weakness in her voice. This is daily. She has minimal discomfort of the incision  Review of Systems     Objective:   Physical Exam On exam, the incision is healing fairly well.  There is no evidence of infection. Again, we discussed the pathology results by phone    Assessment:     Patient status post total thyroidectomy     Plan:     She'll be undergoing radio active iodine therapy soon. I will see her back in one month to see how her voice is progressing. If she still has some weakness, and may need her for her to an ENT physician for evaluation of her vocal cords

## 2013-11-16 ENCOUNTER — Other Ambulatory Visit (INDEPENDENT_AMBULATORY_CARE_PROVIDER_SITE_OTHER)

## 2013-11-16 DIAGNOSIS — E890 Postprocedural hypothyroidism: Secondary | ICD-10-CM

## 2013-11-16 LAB — TSH: TSH: 0.48 u[IU]/mL (ref 0.35–4.50)

## 2013-11-16 LAB — T4, FREE: FREE T4: 1.29 ng/dL (ref 0.60–1.60)

## 2013-11-22 ENCOUNTER — Telehealth: Payer: Self-pay | Admitting: *Deleted

## 2013-11-22 NOTE — Telephone Encounter (Signed)
I called [facility] and they gave me this code [phone]. I will call in the AM to get this straight with [employer].

## 2013-11-22 NOTE — Telephone Encounter (Signed)
Great. Thank you.

## 2013-11-22 NOTE — Telephone Encounter (Signed)
Yes, let's call ASAP.

## 2013-11-22 NOTE — Telephone Encounter (Signed)
Called pt and let her know that we still have not heard from [employer] for the approval for the RAI tx. Pt called [employer] and spoke with a rep there. According to them the CPT code for the whole body scan-post treatment was the wrong one (which is my fault-I tried looking it up). They need [place] to call them back with the correct CPT code. Approval should not be a problem they said, just need the correct CPT. Please advise so that we can call [employer] at [phone] (provider option). Thank you.

## 2013-11-23 ENCOUNTER — Telehealth: Payer: Self-pay | Admitting: *Deleted

## 2013-11-23 NOTE — Telephone Encounter (Signed)
Scheduled pt's RAi tx and WBS: July 2nd pt will stop Synthroid and start low iodine diet. July 17th, pt will come and have TSH drawn; July 22nd pt will go to [facility] for RAi tx at 1:00 pm; July 29th at 8:00 am pt will have WBS. Pt will see Dr Cruzita Lederer Aug. 24th. Called pt and went over the schedule. Pt understood.

## 2013-11-23 NOTE — Telephone Encounter (Signed)
Called [employer] and gave them the corrected CPT codes for the RAi tx [phone]) and the WBS-post treatment [phone]). [employer] approved the procedure. Authorization [id number]. They faxed approval as well. To call and schedule tx today. Be advised.

## 2013-11-26 ENCOUNTER — Encounter (INDEPENDENT_AMBULATORY_CARE_PROVIDER_SITE_OTHER): Payer: Self-pay | Admitting: Surgery

## 2013-11-26 ENCOUNTER — Ambulatory Visit (INDEPENDENT_AMBULATORY_CARE_PROVIDER_SITE_OTHER): Admitting: Surgery

## 2013-11-26 ENCOUNTER — Encounter: Payer: Self-pay | Admitting: Family Medicine

## 2013-11-26 ENCOUNTER — Ambulatory Visit (INDEPENDENT_AMBULATORY_CARE_PROVIDER_SITE_OTHER): Admitting: Family Medicine

## 2013-11-26 VITALS — BP 110/60 | HR 76 | Temp 97.5°F | Wt 130.8 lb

## 2013-11-26 VITALS — BP 117/64 | HR 66 | Temp 98.2°F | Resp 14 | Ht 67.0 in | Wt 129.8 lb

## 2013-11-26 DIAGNOSIS — J3489 Other specified disorders of nose and nasal sinuses: Secondary | ICD-10-CM

## 2013-11-26 DIAGNOSIS — R51 Headache: Secondary | ICD-10-CM

## 2013-11-26 DIAGNOSIS — E890 Postprocedural hypothyroidism: Secondary | ICD-10-CM

## 2013-11-26 DIAGNOSIS — Z09 Encounter for follow-up examination after completed treatment for conditions other than malignant neoplasm: Secondary | ICD-10-CM

## 2013-11-26 DIAGNOSIS — C73 Malignant neoplasm of thyroid gland: Secondary | ICD-10-CM

## 2013-11-26 DIAGNOSIS — R519 Headache, unspecified: Secondary | ICD-10-CM | POA: Insufficient documentation

## 2013-11-26 DIAGNOSIS — R0981 Nasal congestion: Secondary | ICD-10-CM

## 2013-11-26 MED ORDER — LEVOTHYROXINE SODIUM 100 MCG PO TABS: 100.0000 ug | ORAL_TABLET | Freq: Every day | ORAL | Status: DC

## 2013-11-26 NOTE — Progress Notes (Signed)
   BP 110/60  Pulse 76  Temp(Src) 97.5 F (36.4 C) (Oral)  Wt 130 lb 12 oz (59.308 kg)   CC: 6wk f/u  Subjective:    Patient ID: Maria Ray, female    DOB: 22-Jan-1971, 43 y.o.   MRN: [id number]  HPI: Maria Ray is a 43 y.o. female presenting on 11/26/2013 for Follow-up   Here today for follow up. Has established with Dr. Cruzita Lederer. To stop synthroid July 2nd and start low iodine diet, then to have radioactive iodine therapy July 22nd. F/u scheduled with Dr. Cruzita Lederer. Still some loss of high tones in voice. To see surgery later today. Also feeling globus sensation in back of throat. Tends to get headache after she takes generic synthroid. Sinus headaches tend to be on right side of face, she has treated with pseudophed. This is associated with watery eyes and some congestion. Very mild PNdrainage. Does not want INS as this caused nosebleeds in past.  Wt Readings from Last 3 Encounters:  11/26/13 130 lb 12 oz (59.308 kg)  10/23/13 131 lb 12.8 oz (59.784 kg)  10/19/13 131 lb (59.421 kg)   Body mass index is 20.47 kg/(m^2).  Lab Results  Component Value Date   TSH 0.48 11/16/2013    Relevant past medical, surgical, family and social history reviewed and updated as indicated.  Allergies and medications reviewed and updated. Current Outpatient Prescriptions on File Prior to Visit  Medication Sig  . Aspirin-Acetaminophen-Caffeine (EXCEDRIN PO) Take 1 tablet by mouth as needed.   No current facility-administered medications on file prior to visit.    Review of Systems Per HPI unless specifically indicated above    Objective:    BP 110/60  Pulse 76  Temp(Src) 97.5 F (36.4 C) (Oral)  Wt 130 lb 12 oz (59.308 kg)  Physical Exam  Nursing note and vitals reviewed. Constitutional: She appears well-developed and well-nourished. No distress.  HENT:  Nose: No mucosal edema or rhinorrhea.  Pale boggy turbinates  Neck: Normal range of motion. Neck supple. Carotid bruit is not  present. No thyromegaly present.  Cardiovascular: Normal rate, regular rhythm, normal heart sounds and intact distal pulses.   No murmur heard. Pulmonary/Chest: Effort normal and breath sounds normal. No respiratory distress. She has no wheezes. She has no rales.  Musculoskeletal: She exhibits no edema.   Results for orders placed in visit on 11/16/13  TSH      Result Value Ref Range   TSH 0.48  0.35 - 4.50 uIU/mL  T4, FREE      Result Value Ref Range   Free T4 1.29  0.60 - 1.60 ng/dL      Assessment & Plan:   Problem List Items Addressed This Visit   Sinus headache     Intolerant to INS in past 2/2 epistaxis. Advised monitor for triggers of sinus headaches.    Postsurgical hypothyroidism     Tolerating levothyroxine 114mcg daily. Upcoming radioactive iodine treatment per Endo.    Relevant Medications      levothyroxine (SYNTHROID, LEVOTHROID) tablet   Papillary thyroid carcinoma - Primary     F/u with surgery later today.    Relevant Medications      levothyroxine (SYNTHROID, LEVOTHROID) tablet       Follow up plan: Return as needed, for follow up visit.

## 2013-11-26 NOTE — Assessment & Plan Note (Signed)
Intolerant to INS in past 2/2 epistaxis. Advised monitor for triggers of sinus headaches.

## 2013-11-26 NOTE — Progress Notes (Signed)
Subjective:     Patient ID: Maria Ray, female   DOB: 12-13-1970, 43 y.o.   MRN: [id number]  HPI She is here for another postoperative visit status post total thyroidectomy for papillary thyroid cancer. She reports less hoarseness in her voice but still has difficulty shouting or hitting high notes while sitting. She also has discomfort in her throat with swallowing. She will be receiving radioactive iodine approximately July 22  Review of Systems     Objective:   Physical Exam On exam, her incision is healing well. There is much less swelling. Her regular voice sounds normal    Assessment:     Patient stable postop     Plan:     She will continue her current care. I will see her back in approximately 1-1/2 months. If her symptoms persist, I will refer her to an ENT surgeon for evaluation of her vocal cords. She will continue her anti-scar cream.

## 2013-11-26 NOTE — Assessment & Plan Note (Signed)
F/u with surgery later today.

## 2013-11-26 NOTE — Progress Notes (Signed)
Pre visit review using our clinic review tool, if applicable. No additional management support is needed unless otherwise documented below in the visit note. 

## 2013-11-26 NOTE — Patient Instructions (Signed)
Good to see you today, call us with questions. Ok to continue pseudophed for sinus congestion.

## 2013-11-26 NOTE — Assessment & Plan Note (Signed)
Tolerating levothyroxine 146mcg daily. Upcoming radioactive iodine treatment per Endo.

## 2013-12-14 ENCOUNTER — Other Ambulatory Visit: Payer: Self-pay | Admitting: *Deleted

## 2013-12-14 ENCOUNTER — Other Ambulatory Visit: Payer: Self-pay | Admitting: Internal Medicine

## 2013-12-14 ENCOUNTER — Other Ambulatory Visit (INDEPENDENT_AMBULATORY_CARE_PROVIDER_SITE_OTHER)

## 2013-12-14 DIAGNOSIS — C73 Malignant neoplasm of thyroid gland: Secondary | ICD-10-CM

## 2013-12-14 LAB — TSH: TSH: 16.15 u[IU]/mL — AB (ref 0.35–4.50)

## 2013-12-19 ENCOUNTER — Encounter (HOSPITAL_COMMUNITY)

## 2013-12-20 ENCOUNTER — Telehealth: Payer: Self-pay | Admitting: Internal Medicine

## 2013-12-20 NOTE — Telephone Encounter (Signed)
Patient need authorization for nuc med study for [facility], for Outpatient precert,  she failed to give me her name and location and number.

## 2013-12-20 NOTE — Telephone Encounter (Signed)
Lvm for pt to return call.

## 2013-12-20 NOTE — Telephone Encounter (Signed)
Spoke with [employer]. All taken care of. They are to fax a copy of the approval to our office.

## 2013-12-21 ENCOUNTER — Other Ambulatory Visit (INDEPENDENT_AMBULATORY_CARE_PROVIDER_SITE_OTHER)

## 2013-12-21 DIAGNOSIS — C73 Malignant neoplasm of thyroid gland: Secondary | ICD-10-CM

## 2013-12-21 LAB — TSH: TSH: 18.71 u[IU]/mL — AB (ref 0.35–4.50)

## 2013-12-24 ENCOUNTER — Telehealth: Payer: Self-pay | Admitting: *Deleted

## 2013-12-24 NOTE — Telephone Encounter (Signed)
Called pt and advised her that her TSH was still too high to do the [place] tx. Pt understood. Scheduled lab appt to recheck TSH on Mon, Aug 17th. Re-scheduled RAi tx for Wed, Aug 19th at 1:00 pm (12:45 arrival time) and WBS for Fri, Aug 28th at 8:00 am (7:45 arrival time). Pt aware of the schedule change. Be advised.

## 2013-12-25 ENCOUNTER — Encounter (HOSPITAL_COMMUNITY): Admission: RE | Admit: 2013-12-25 | Source: Ambulatory Visit

## 2013-12-26 ENCOUNTER — Encounter (HOSPITAL_COMMUNITY): Admission: RE | Admit: 2013-12-26 | Source: Ambulatory Visit

## 2014-01-01 ENCOUNTER — Encounter (HOSPITAL_COMMUNITY)

## 2014-01-04 ENCOUNTER — Encounter (INDEPENDENT_AMBULATORY_CARE_PROVIDER_SITE_OTHER): Payer: Self-pay | Admitting: Surgery

## 2014-01-04 ENCOUNTER — Ambulatory Visit (INDEPENDENT_AMBULATORY_CARE_PROVIDER_SITE_OTHER): Admitting: Surgery

## 2014-01-04 VITALS — BP 122/70 | HR 62 | Temp 98.5°F | Ht 67.0 in | Wt 124.0 lb

## 2014-01-04 DIAGNOSIS — E890 Postprocedural hypothyroidism: Secondary | ICD-10-CM

## 2014-01-04 DIAGNOSIS — Z09 Encounter for follow-up examination after completed treatment for conditions other than malignant neoplasm: Secondary | ICD-10-CM

## 2014-01-04 DIAGNOSIS — Z9889 Other specified postprocedural states: Secondary | ICD-10-CM

## 2014-01-04 NOTE — Progress Notes (Signed)
Subjective:     Patient ID: Maria Ray, female   DOB: 19-Jan-1971, 43 y.o.   MRN: [id number]  HPI She is here for another visit regarding her hoarseness. She underwent a thyroidectomy for papillary cancer which is metastatic to a node in April. She did have some mild hoarseness and voice weakness postoperatively which was improving. She now has increasing hoarseness and fatigue of her voice.  She has yet to undergo radioactive iodine therapy  Review of Systems     Objective:   Physical Exam On exam, her incision is well-healed. There is no palpable mass in the neck. Her voice is hoarse     Assessment:     Hoarseness status post thyroidectomy     Plan:     I will refer her to [facility] for evaluation. I will see her back in 3 months

## 2014-01-07 ENCOUNTER — Telehealth (INDEPENDENT_AMBULATORY_CARE_PROVIDER_SITE_OTHER): Payer: Self-pay | Admitting: *Deleted

## 2014-01-07 NOTE — Telephone Encounter (Signed)
Per pt, I called and LMOM regarding her referral to [facility].  I advised pt it has been scheduled for 01-22-14 arriving at 9:45 for 10:00 apt.  I advised pt their address was [address] and phone number is [phone].  Anderson Malta

## 2014-01-14 ENCOUNTER — Other Ambulatory Visit: Payer: Self-pay | Admitting: *Deleted

## 2014-01-14 ENCOUNTER — Other Ambulatory Visit (INDEPENDENT_AMBULATORY_CARE_PROVIDER_SITE_OTHER)

## 2014-01-14 ENCOUNTER — Telehealth: Payer: Self-pay | Admitting: Internal Medicine

## 2014-01-14 DIAGNOSIS — E890 Postprocedural hypothyroidism: Secondary | ICD-10-CM

## 2014-01-14 LAB — TSH: TSH: 52.17 u[IU]/mL — ABNORMAL HIGH (ref 0.35–4.50)

## 2014-01-14 LAB — T4, FREE: Free T4: 0.17 ng/dL — ABNORMAL LOW (ref 0.60–1.60)

## 2014-01-14 NOTE — Telephone Encounter (Signed)
Could you review labs during Dr. Arman Filter absence.  Thanks!

## 2014-01-14 NOTE — Telephone Encounter (Signed)
please call [facility]: Pt is ready for I-131 rx.  When is it scheduled for?

## 2014-01-14 NOTE — Telephone Encounter (Signed)
Pt requesting call regarding the procedure on wednesday

## 2014-01-15 NOTE — Telephone Encounter (Signed)
Pt scheduled for 1-131 tomorrow. Pt advised of labs and to proceed as scheduled.

## 2014-01-16 ENCOUNTER — Encounter (HOSPITAL_COMMUNITY)
Admission: RE | Admit: 2014-01-16 | Discharge: 2014-01-16 | Disposition: A | Discharge status: Home / Self Care | Source: Ambulatory Visit | Attending: Internal Medicine | Admitting: Internal Medicine

## 2014-01-16 DIAGNOSIS — C73 Malignant neoplasm of thyroid gland: Secondary | ICD-10-CM | POA: Insufficient documentation

## 2014-01-16 MED ORDER — SODIUM IODIDE I 131 CAPSULE
119.8000 | Freq: Once | INTRAVENOUS | Status: AC | PRN
  Administered 2014-01-16: 119.8 via ORAL

## 2014-01-18 ENCOUNTER — Telehealth: Payer: Self-pay | Admitting: Internal Medicine

## 2014-01-18 MED ORDER — LEVOTHYROXINE SODIUM 125 MCG PO TABS: 125.0000 ug | ORAL_TABLET | Freq: Every day | ORAL | Status: DC

## 2014-01-18 NOTE — Telephone Encounter (Signed)
i have sent a prescription to your pharmacy, to start the thyroid pill.  You can start now. Please come back to Dr Cruzita Lederer for a follow-up appointment in 1 month.

## 2014-01-18 NOTE — Telephone Encounter (Signed)
Pt advised of the following. Pt will go back on normal diet today and start med on 8/22.

## 2014-01-18 NOTE — Telephone Encounter (Signed)
See below and please advise during Dr. Arman Filter absence. Thanks!

## 2014-01-18 NOTE — Telephone Encounter (Signed)
Patient stated that she is finished taking her radioactive treatment, and would like to know when she should start taking her meds. Please advise

## 2014-01-21 ENCOUNTER — Ambulatory Visit: Admitting: Internal Medicine

## 2014-01-22 ENCOUNTER — Telehealth: Payer: Self-pay

## 2014-01-22 NOTE — Telephone Encounter (Signed)
Patient notified. She mentioned some swelling and numbness to her legs and ankles that started after she started radiation and asked if they could be related. I advised that I wasn't sure and it would probably be best to be evaluated. No CP, SOB.  Appt scheduled for tomorrow.

## 2014-01-22 NOTE — Telephone Encounter (Signed)
Will see then. 

## 2014-01-22 NOTE — Telephone Encounter (Signed)
Pt left v/m; pt was instructed by Dr Darnell Level if pts thyroid med is different to call Dr Darnell Level. Pt picked up thyroid med Dr Loanne Drilling called in and it is a different color. Pt request cb. ( appears from med list Dr Loanne Drilling sent levothyroxine 125 mcg and on historical list pt had been taking levothyroxine 100 mcg.)

## 2014-01-22 NOTE — Telephone Encounter (Signed)
Ok for this change as currently undergoing rad I treatment by endo. rec f/u with Dr. Cruzita Lederer at next scheduled appt (02/11/2014)

## 2014-01-23 ENCOUNTER — Telehealth: Payer: Self-pay | Admitting: Family Medicine

## 2014-01-23 ENCOUNTER — Ambulatory Visit (INDEPENDENT_AMBULATORY_CARE_PROVIDER_SITE_OTHER): Admitting: Family Medicine

## 2014-01-23 ENCOUNTER — Encounter: Payer: Self-pay | Admitting: Family Medicine

## 2014-01-23 VITALS — BP 110/60 | HR 62 | Temp 98.1°F | Wt 130.0 lb

## 2014-01-23 DIAGNOSIS — R5381 Other malaise: Secondary | ICD-10-CM | POA: Insufficient documentation

## 2014-01-23 DIAGNOSIS — R519 Headache, unspecified: Secondary | ICD-10-CM

## 2014-01-23 DIAGNOSIS — R51 Headache: Secondary | ICD-10-CM

## 2014-01-23 DIAGNOSIS — C73 Malignant neoplasm of thyroid gland: Secondary | ICD-10-CM

## 2014-01-23 DIAGNOSIS — R5383 Other fatigue: Secondary | ICD-10-CM

## 2014-01-23 DIAGNOSIS — E890 Postprocedural hypothyroidism: Secondary | ICD-10-CM

## 2014-01-23 MED ORDER — LEVOTHYROXINE SODIUM 100 MCG PO TABS: 100.0000 ug | ORAL_TABLET | Freq: Every day | ORAL | Status: DC

## 2014-01-23 MED ORDER — KETOROLAC TROMETHAMINE 60 MG/2ML IM SOLN
30.0000 mg | Freq: Once | INTRAMUSCULAR | Status: AC
  Administered 2014-01-23: 30 mg via INTRAMUSCULAR

## 2014-01-23 MED ORDER — TRAMADOL HCL 50 MG PO TABS: 25.0000 mg | ORAL_TABLET | Freq: Two times a day (BID) | ORAL | Status: DC | PRN

## 2014-01-23 NOTE — Assessment & Plan Note (Signed)
Has not been feeling well for last week - since radioactive iodine therapy. Exam today not consistent with myxedema or ophthalmopathy from radioiodine. Advised give this more time - and to call me in 1 wk with update.

## 2014-01-23 NOTE — Assessment & Plan Note (Signed)
Confirmed with Dr. Cruzita Lederer - will call pt to notify she restart 162mcg dose. Several of today's sxs contributable to low thyroid.

## 2014-01-23 NOTE — Telephone Encounter (Signed)
Patient notified and will restart levothyroxine 100 mcg and keep appts as scheduled.

## 2014-01-23 NOTE — Assessment & Plan Note (Signed)
R sided headache behind eye has returned. Previously attributed to cluster headache, but now anticipate related to iatrogenic hypothyroid state in preparation for radioactive iodine therapy. Now that she has restarted levothyroxine I expect these headaches to improve (previously improved with normalization of thyroid function on levothyroxine). Will provide with toradol shot today (HA has responded well to this in past) and provided pt with tramadol prescription to take in interim - with discussion of sedation precautions and rec start at 1/2 tab to monitor effect.

## 2014-01-23 NOTE — Patient Instructions (Signed)
I wonder if leg and eye swelling due to radioiodine. This should improve with time. If not getting better let me know. I will touch base with Dr. Cruzita Lederer about this and let you know plan in next 1-2 days. I think headache is due to being off thyroid medication - and this should also improve with time. Give me an update in next week with how you're feeling - should be feeling better. I think it's a good idea to stay out of work for next few days.

## 2014-01-23 NOTE — Assessment & Plan Note (Signed)
S/p excision then radioactive iodine therapy.

## 2014-01-23 NOTE — Progress Notes (Signed)
BP 110/60  Pulse 62  Temp(Src) 98.1 F (36.7 C) (Oral)  Wt 130 lb (58.968 kg)  SpO2 99%   CC: discuss edema, headache  Subjective:    Patient ID: Maria Ray, female    DOB: 1970-10-01, 43 y.o.   MRN: [id number]  HPI: Maria Ray is a 43 y.o. 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   Last week underwent radiotherapy 01/16/2014. Since then not feeling well. Has noticed some swelling of ankles and face around eyes, noticing some leg discomfort described as paresthesias but no skin changes noted lower legs. Endorses tired feeling legs since radioiodine therapy. No thyroid pain.  Also noticing worsening headaches behind R eye "like someone pushing eye out" since she's been off thyroid medication. Recent trip to [place], initially attributed to sinuses but nothing has helped - tried pseudophed. Same headache as what she had prior to thyroid surgery.  Not satisfied with endo care - multiple issues over past month.  Unsure why recent thyroid dose was increased to levothyroxine 164mcg daily.  Relevant past medical, surgical, family and social history reviewed and updated as indicated.  Allergies and medications reviewed and updated. Current Outpatient Prescriptions on File Prior to Visit  Medication Sig  . Aspirin-Acetaminophen-Caffeine (EXCEDRIN PO) Take 1 tablet by mouth as needed.   No current facility-administered medications on file prior to visit.    Review of Systems Per HPI unless specifically indicated above    Objective:    BP 110/60  Pulse 62  Temp(Src) 98.1 F (36.7 C) (Oral)  Wt 130 lb (58.968 kg)  SpO2 99%  Physical Exam  Nursing note and vitals reviewed. Constitutional: She appears well-developed and well-nourished. No distress.  HENT:  Head: Normocephalic and atraumatic.  Mouth/Throat:  Oropharynx is clear and moist. No oropharyngeal exudate.  Mild periorbital swelling noted below eyes  Eyes: Conjunctivae and EOM are normal. Pupils are equal, round, and reactive to light. No scleral icterus.  Neck: Normal range of motion. Neck supple.  Cardiovascular: Normal rate, regular rhythm, normal heart sounds and intact distal pulses.   No murmur heard. Pulmonary/Chest: Effort normal and breath sounds normal. No respiratory distress. She has no wheezes. She has no rales.  Musculoskeletal: She exhibits edema (mild nonpitting).  Lymphadenopathy:    She has no cervical adenopathy.  Skin: Skin is warm and dry. No rash noted.   Results for orders placed in visit on 01/14/14  TSH      Result Value Ref Range   TSH 52.17 (*) 0.35 - 4.50 uIU/mL  T4, FREE      Result Value Ref Range   Free T4 0.17 (*) 0.60 - 1.60 ng/dL      Assessment & Plan:   Problem List Items Addressed This Visit   Frequent headaches     R sided headache behind eye has returned. Previously attributed to cluster headache, but now anticipate related to iatrogenic hypothyroid state in preparation for radioactive iodine therapy. Now that she has restarted levothyroxine I expect these headaches to improve (previously improved with normalization of thyroid function on levothyroxine). Will provide with toradol shot today (HA has responded well to this in past) and provided pt with tramadol prescription to take in interim - with discussion of sedation precautions and rec start at 1/2 tab to monitor effect.    Relevant Medications  ULTRAM 50 MG PO TABS      ketorolac (TORADOL) injection 30 mg (Completed)   Malaise - Primary     Has not been feeling well for last week - since radioactive iodine therapy. Exam today not consistent with myxedema or ophthalmopathy from radioiodine. Advised give this more time - and to call me in 1 wk with update.    Papillary thyroid carcinoma     S/p excision then radioactive iodine  therapy.    Relevant Medications      ULTRAM 50 MG PO TABS      ketorolac (TORADOL) injection 30 mg (Completed)      levothyroxine (SYNTHROID, LEVOTHROID) tablet   Postsurgical hypothyroidism     Confirmed with Dr. Cruzita Lederer - will call pt to notify she restart 184mcg dose. Several of today's sxs contributable to low thyroid.    Relevant Medications      levothyroxine (SYNTHROID, LEVOTHROID) tablet    Other Visit Diagnoses   Headache(784.0)        Relevant Medications       ULTRAM 50 MG PO TABS       ketorolac (TORADOL) injection 30 mg (Completed)        Follow up plan: Return if symptoms worsen or fail to improve.   I discussed pt concerns with Dr. Cruzita Lederer today.

## 2014-01-23 NOTE — Progress Notes (Signed)
Pre visit review using our clinic review tool, if applicable. No additional management support is needed unless otherwise documented below in the visit note. 

## 2014-01-23 NOTE — Telephone Encounter (Signed)
Plz notify I talked with Dr. Cruzita Lederer who thinks large portion of how she's feeling is low thyroid state in body - this should get better with thyroid replacement. Restart levothyroxine, but let's restart at 129mcg dose instead of 160mcg dose (158mcg dose sent to pharmacy), may take 176mcg dose she had from prior to surgery. Keep appointment on 02/11/2014 with Dr. Cruzita Lederer.

## 2014-01-25 ENCOUNTER — Encounter: Payer: Self-pay | Admitting: Internal Medicine

## 2014-01-25 ENCOUNTER — Encounter (HOSPITAL_COMMUNITY)
Admission: RE | Admit: 2014-01-25 | Discharge: 2014-01-25 | Disposition: A | Discharge status: Home / Self Care | Source: Ambulatory Visit | Attending: Internal Medicine | Admitting: Internal Medicine

## 2014-01-25 DIAGNOSIS — C73 Malignant neoplasm of thyroid gland: Secondary | ICD-10-CM | POA: Diagnosis not present

## 2014-01-29 ENCOUNTER — Telehealth: Payer: Self-pay | Admitting: Family Medicine

## 2014-01-29 ENCOUNTER — Encounter: Payer: Self-pay | Admitting: Family Medicine

## 2014-01-29 ENCOUNTER — Encounter: Payer: Self-pay | Admitting: Internal Medicine

## 2014-01-29 DIAGNOSIS — R490 Dysphonia: Secondary | ICD-10-CM

## 2014-01-29 HISTORY — DX: Dysphonia: R49.0

## 2014-01-29 NOTE — Telephone Encounter (Signed)
Dr. Cruzita Lederer, Maria Ray is planning on taking a trip out of the state this weekend that will entail flying on an airplane. Can you prepare a letter for her with recent radioactive iodine treatment information for her to have on hand in case any issues with airport detectors? Let me know what you think. Thanks, Garlon Hatchet

## 2014-01-29 NOTE — Telephone Encounter (Signed)
Done, will contact her to see how we can send it to her in time.

## 2014-01-30 ENCOUNTER — Encounter: Payer: Self-pay | Admitting: Family Medicine

## 2014-01-30 NOTE — Telephone Encounter (Signed)
Great - thanks

## 2014-02-11 ENCOUNTER — Encounter: Payer: Self-pay | Admitting: Internal Medicine

## 2014-02-11 ENCOUNTER — Ambulatory Visit (INDEPENDENT_AMBULATORY_CARE_PROVIDER_SITE_OTHER): Admitting: Internal Medicine

## 2014-02-11 VITALS — BP 110/62 | HR 74 | Temp 97.8°F | Resp 12 | Wt 128.0 lb

## 2014-02-11 DIAGNOSIS — E890 Postprocedural hypothyroidism: Secondary | ICD-10-CM

## 2014-02-11 DIAGNOSIS — C73 Malignant neoplasm of thyroid gland: Secondary | ICD-10-CM

## 2014-02-11 NOTE — Progress Notes (Signed)
Patient ID: Maria Ray, female   DOB: Jan 23, 1971, 43 y.o.   MRN: [id number]   HPI  Maria Ray is a 43 y.o.-year-old female, returning for f/u for papillary thyroid cancer and postsurgical hypothyroidism. Last visit 4 mo ago.  Reviewed hx: She started to feel very tired, cluster HAs, SOB x 2, hoarseness for 2.5 years. She then saw Dr Danise Mina on 07/2013 >> nodule palpated >> sent for thyroid U/S >> MNG >> the dominant nodule was Bx'd >> Thy CA >> sent for surgery (Dr Coralie Keens) >> 10/09/2013 >> PTC - 3 foci, metastatic in 1/1 LN.   ThyCa history: - 08/27/2013: Thyroid U/S [facility]):   Right lobe: 0.9 x 0.9 x 1 cm hypoechoic solid-appearing nodule with calcifications in upper pole. Solid-appearing nodule with calcifications measuring 2.4 x 1.5 x 2 cm mid pole. 0.7 x 0.5 x 0.4 cm complex appearing nodule in the lower pole  Left lobes: No nodules visualized  Isthmus: No nodules visualized  Lymphadenopathy: None visualized - 08/30/2013: Thyroid nodule biopsy [facility]): findings consistent with papillary carcinoma - 10/09/2013: Total thyroidectomy by Dr. Ninfa Linden - PAPILLARY THYROID CARCINOMA, MULTIPLE FOCI, THE LARGEST SPANS 2.5 CM, CONFINED TO THE THYROID. - LYMPHOVASCULAR INVASION IS IDENTIFIED. - ADENOCARCINOMA IS BROADLY LESS THAN 0.1 MM TO THE INKED MARGIN OF THE RIGHT MIDDLE LOBE. - PARATHYROID TISSUE (NEAR RIGHT LOWER LOBE). Lymph node, biopsy, Right lateral compartmental - METASTATIC PAPILLARY THYROID CARCINOMA IN 1 OF 1 LYMPH NODE (1/1). Tumor focality: Multiple foci (at least 4 grossly identified foci) Dominant tumor: Maximum tumor size (cm): 2.5 cm (gross measurement) Tumor laterality: Right lower lobe Histologic type: Papillary thyroid carcinoma Tumor capsule: Present Extrathyroidal extension: Not identified Margins: Negative for carcinoma Lymph - Vascular invasion: Present Capsular invasion with degree of invasion if present: Not identified Second largest tumor: Tumor  size(s): 1.0 cm (gross measurement) Tumor laterality: Right upper pole Histologic type : Papillary thyroid carcinoma Tumor capsule: Present Extrathyroidal extension: Not identified Margins: Negative for carcinoma Lymph - Vascular invasion: Present Capsular invasion with degree of invasion if present: Not identified  Lymph nodes: # examined 1; # positive; 1 TNM code: mpT2, pN1 Non-neoplastic thyroid: Lymphocytic thyroiditis  Comments: There are at least four grossly identified foci of papillary thyroid carcinoma, the largest spanning 2.5 cm. There is associated lymphovascular invasion. The 0.6 cm focus of carcinoma in the right middle lobe is broadly less than 0.1 cm from the inked resection margin. - 10/11/2013: Started Levothyroxine 100 mcg  - 01/16/2014: RAI tx 119 mCi I131 - 01/25/2014: post-Tx WBS:  1. Expected remnant activity in the neck. Potential small lymph node metastasis.  2. No evidence of distant metastasis.  We had problems allowing her the TSH to rise after stopping the Synthroid in preparation for the RAI tx (she was traveling) she was off the med for longer >> TSH increased to 52.  She takes Synthroid 100 mcg - restarted 20 days ago: - fasting - with water - separated by >30 min from b'fast  - no calcium, iron, multivitamins  - was on a 2 week course of Nexium - at bedtime  I reviewed pt's latest thyroid tests: Lab Results  Component Value Date   TSH 52.17* 01/14/2014   TSH 18.71* 12/21/2013   TSH 16.15* 12/14/2013   TSH 0.48 11/16/2013   TSH 2.390 01/15/2013   FREET4 0.17* 01/14/2014   FREET4 1.29 11/16/2013    Pt denies feeling nodules in neck, hoarseness, odynophagia, SOB with lying down. She had dysphagia  postop >> saw ENT >> was told her vocal cords are inflamed >> advised to start Tx for GERD >> she is now finishing a 2 week-curse w/o result.  Pt c/o: - " I do not feel good ever since my RAI tx" - + cold/heat intolerance - no weight gain; she lost weight  on the low iodine diet, now improving - + fatigue - no constipation - no dry skin - no hair falling - no depression  I reviewed pt's medications, allergies, PMH, social hx, family hx and no changes required, except as mentioned above.  ROS: Constitutional: no weight gain/loss, + fatigue,  No subjective hyperthermia/hypothermia Eyes: no blurry vision, no xerophthalmia ENT: no sore throat, no nodules palpated in throat, no dysphagia/odynophagia, no hoarseness Cardiovascular: no CP/SOB/palpitations/leg swelling Respiratory: no cough/SOB Gastrointestinal: no N/V/D/C Musculoskeletal: no muscle aches/no joint aches Skin: no rashes Neurological: no tremors/numbness/tingling/dizziness, + HA - today  PE: BP 110/62  Pulse 74  Temp(Src) 97.8 F (36.6 C) (Oral)  Resp 12  Wt 128 lb (58.06 kg)  SpO2 98% Wt Readings from Last 3 Encounters:  02/11/14 128 lb (58.06 kg)  01/23/14 130 lb (58.968 kg)  01/04/14 124 lb (56.246 kg)   Constitutional: normal weight, in NAD Eyes: PERRLA, EOMI, no exophthalmos ENT: moist mucous membranes, cervical scar healing, no dysesthesia, no cervical lymphadenopathy Cardiovascular: RRR, No MRG Respiratory: CTA B Gastrointestinal: abdomen soft, NT, ND, BS+ Musculoskeletal: no deformities, strength intact in all 4 Skin: moist, warm, no rashes Neurological: no tremor with outstretched hands, DTR normal in all 4  ASSESSMENT: 1. PTC (palpillary ThyCA) - stage 1, 2/2 age <44 y/o  2. Postsurgical Hypothyroidism  PLAN:  1. And 2. Patient with recent dx of PTC, s/p thyroidectomy - She does not appear to have pain or discomfort at the surgical site  - we reviewed her post-tx WBS and reviewed pathology.  - we discussed about the plan to repeat a WBS in 1 year after the previous, but this would be Thyrogen-stimulated. - will check a Tg + ATA (explained the fact that this is a tumor marker) when she comes back in 3 mo  2. Iatrogenic hypothyroidism - We discussed  about correct intake of levothyroxine, fasting, with water, separated by at least 30 minutes from breakfast, and separated by more than 4 hours from calcium, iron, multivitamins, acid reflux medications (PPIs). She is taking it correctly.  - Continue 100 mcg Levothyroxine >> we'll check thyroid tests in 2 weeks: TSH, free T4 (ordered) - we discussed that we need her TSH a little lower than the LLN for the first year after surgery

## 2014-02-11 NOTE — Patient Instructions (Signed)
Please come for labs in 2 weeks. Please come back for a follow-up appointment in 3 months.

## 2014-02-22 ENCOUNTER — Encounter: Payer: Self-pay | Admitting: Family Medicine

## 2014-02-22 ENCOUNTER — Ambulatory Visit (INDEPENDENT_AMBULATORY_CARE_PROVIDER_SITE_OTHER): Admitting: Family Medicine

## 2014-02-22 VITALS — BP 112/72 | HR 65 | Temp 97.9°F

## 2014-02-22 DIAGNOSIS — C73 Malignant neoplasm of thyroid gland: Secondary | ICD-10-CM

## 2014-02-22 DIAGNOSIS — E890 Postprocedural hypothyroidism: Secondary | ICD-10-CM

## 2014-02-22 DIAGNOSIS — R490 Dysphonia: Secondary | ICD-10-CM

## 2014-02-22 DIAGNOSIS — Z23 Encounter for immunization: Secondary | ICD-10-CM

## 2014-02-22 NOTE — Assessment & Plan Note (Signed)
S/p thyroidectomy then radioactive iodine therapy. 

## 2014-02-22 NOTE — Progress Notes (Signed)
Pre visit review using our clinic review tool, if applicable. No additional management support is needed unless otherwise documented below in the visit note. 

## 2014-02-22 NOTE — Assessment & Plan Note (Signed)
PPI trial did not improve sxs. Slowly notes improvement.

## 2014-02-22 NOTE — Patient Instructions (Addendum)
Flu shot today. Schedule nurse visit for the family. Good to see you today - I'm glad you're feeling better! Return to see me in 4 months for follow up.

## 2014-02-22 NOTE — Assessment & Plan Note (Signed)
Continues to improve as she continues to take levothyroxine 16mcg dose. Has f/u labwork planned on Monday. Wants to continue to see Dr. Cruzita Lederer

## 2014-02-22 NOTE — Progress Notes (Signed)
   BP 112/72  Pulse 65  Temp(Src) 97.9 F (36.6 C) (Oral)  SpO2 98%   CC: f/u visit  Subjective:    Patient ID: Maria Ray, female    DOB: 08-03-1970, 43 y.o.   MRN: [id number]  HPI: Maria Ray is a 43 y.o. female presenting on 02/22/2014 for Follow-up   Papillary thyroid carcinoma s/p thyroidectomy and then radiotherapy 01/16/2014. Postsurgical hypothyroidism on levothyroxine 183mcg daily.  Reviewed Dr. Arman Filter latest note. Planned rpt TFTs next Monday and f/u with her 3 months with Tg + ATA.   Has been staying active at home.  Headaches have resolved.  Malaise/fatigue improving.  Currently undergoing dental work.  Hoarse voice is slowly improving. Over the past week noticing some flushed feeling and palpitations at night time. Very mild, last a minute. Occasionally during the day as well.   Relevant past medical, surgical, family and social history reviewed and updated as indicated.  Allergies and medications reviewed and updated. Current Outpatient Prescriptions on File Prior to Visit  Medication Sig  . Aspirin-Acetaminophen-Caffeine (EXCEDRIN PO) Take 1 tablet by mouth as needed.  Marland Kitchen levothyroxine (SYNTHROID, LEVOTHROID) 100 MCG tablet Take 1 tablet (100 mcg total) by mouth daily.   No current facility-administered medications on file prior to visit.    Review of Systems Per HPI unless specifically indicated above    Objective:    BP 112/72  Pulse 65  Temp(Src) 97.9 F (36.6 C) (Oral)  SpO2 98%  Physical Exam  Nursing note and vitals reviewed. Constitutional: She appears well-developed and well-nourished. No distress.  HENT:  Head: Normocephalic and atraumatic.  Nose: Nose normal. No mucosal edema or rhinorrhea.  Mouth/Throat: Oropharynx is clear and moist. No oropharyngeal exudate.  Eyes: Conjunctivae and EOM are normal. Pupils are equal, round, and reactive to light. No scleral icterus.  Neck: Normal range of motion. Neck supple.  transverse scar on neck    Cardiovascular: Normal rate, regular rhythm, normal heart sounds and intact distal pulses.   No murmur heard. Pulmonary/Chest: Effort normal and breath sounds normal. No respiratory distress. She has no wheezes. She has no rales.  Musculoskeletal: She exhibits no edema.  Lymphadenopathy:    She has no cervical adenopathy.  Skin: Skin is warm and dry. No rash noted.  Psychiatric: She has a normal mood and affect.      Assessment & Plan:   Problem List Items Addressed This Visit   Postsurgical hypothyroidism     Continues to improve as she continues to take levothyroxine 178mcg dose. Has f/u labwork planned on Monday. Wants to continue to see Dr. Cruzita Lederer    Papillary thyroid carcinoma - Primary     S/p thyroidectomy then radioactive iodine therapy.    Hoarseness of voice     PPI trial did not improve sxs. Slowly notes improvement.        Follow up plan: Return in about 4 months (around 06/24/2014), or as needed, for follow up.

## 2014-02-22 NOTE — Addendum Note (Signed)
Addended by: Lurlean Nanny on: 02/22/2014 10:54 AM   Modules accepted: Orders

## 2014-02-25 ENCOUNTER — Other Ambulatory Visit

## 2014-02-26 ENCOUNTER — Other Ambulatory Visit (INDEPENDENT_AMBULATORY_CARE_PROVIDER_SITE_OTHER)

## 2014-02-26 ENCOUNTER — Other Ambulatory Visit

## 2014-02-26 DIAGNOSIS — E890 Postprocedural hypothyroidism: Secondary | ICD-10-CM

## 2014-02-26 LAB — TSH: TSH: 0.46 u[IU]/mL (ref 0.35–4.50)

## 2014-03-28 ENCOUNTER — Encounter: Payer: Self-pay | Admitting: Family Medicine

## 2014-03-28 ENCOUNTER — Ambulatory Visit (INDEPENDENT_AMBULATORY_CARE_PROVIDER_SITE_OTHER): Admitting: Family Medicine

## 2014-03-28 VITALS — BP 118/70 | HR 68 | Temp 97.9°F | Wt 125.5 lb

## 2014-03-28 DIAGNOSIS — M25511 Pain in right shoulder: Secondary | ICD-10-CM | POA: Insufficient documentation

## 2014-03-28 MED ORDER — NAPROXEN 500 MG PO TABS: ORAL_TABLET | ORAL | Status: DC

## 2014-03-28 NOTE — Progress Notes (Signed)
BP 118/70  Pulse 68  Temp(Src) 97.9 F (36.6 C) (Oral)  Wt 125 lb 8 oz (56.926 kg)   CC: R arm pain  Subjective:    Patient ID: Maria Ray, female    DOB: 21-Jun-1970, 43 y.o.   MRN: [id number]  HPI: Maria Ray is a 43 y.o. female presenting on 03/28/2014 for Arm Pain   R handed. R upper arm discomfort ongoing for the past several months, worsening over the past month. Dull ache lateral upper arm along deltoid. Constant pain. Worse if she sleeps on arm - then becomes sharp pain that wakes her up. Limited ROM of arm. Denies inciting trauma, injury. Holds L arm and rubs arm for support.   No neck pain, shooting pain down arm, paresthesias or weakness of arm.   Has tried flexeril but this med her sick. Did also try back and body ache. Heating pad has helped some.   Relevant past medical, surgical, family and social history reviewed and updated as indicated.  Allergies and medications reviewed and updated. Current Outpatient Prescriptions on File Prior to Visit  Medication Sig  . levothyroxine (SYNTHROID, LEVOTHROID) 100 MCG tablet Take 1 tablet (100 mcg total) by mouth daily.  . Aspirin-Acetaminophen-Caffeine (EXCEDRIN PO) Take 1 tablet by mouth as needed.   No current facility-administered medications on file prior to visit.   Past Medical History  Diagnosis Date  . Thyroid nodule 07/2013    right  . Allergic rhinitis   . Papillary thyroid carcinoma 09/03/2013    s/p thyroidectomy then radioiodine ablation per endo  . Migraine     "maybe a couple times/yr" (10/09/2013)  . Cluster headache   . Hoarseness of voice 01/2014    after thyroidectomy s/p ENT eval ?GERD - did not improve w/ PPI trial   Past Surgical History  Procedure Laterality Date  . Mri  2010    brain WNL per patient  . Tympanostomy  child  . Thyroidectomy, partial  10/09/2013    "really a total but called it subtotal cause he left 1 cell"  . Tonsillectomy and adenoidectomy  1976  . Carpal tunnel with  cubital tunnel Bilateral 2004-2006  . Abdominal hysterectomy  2010    partial; heavy bleeding, ovaries remain  . Reduction mammaplasty  1990  . Thyroidectomy N/A 10/09/2013    Procedure: THYROIDECTOMY SUBTOTAL;  Surgeon: Harl Bowie, MD;  Location: [place];  Service: General;  Laterality: N/A;   Review of Systems Per HPI unless specifically indicated above    Objective:    BP 118/70  Pulse 68  Temp(Src) 97.9 F (36.6 C) (Oral)  Wt 125 lb 8 oz (56.926 kg)  Physical Exam  Nursing note and vitals reviewed. Constitutional: She is oriented to person, place, and time. She appears well-developed and well-nourished. No distress.  Musculoskeletal: She exhibits no edema.  L shoulder WNL R Shoulder exam: No deformity of shoulders on inspection. +pain with palpation of subacromial and subdeltoid bursitis as well as lateral deltoid FROM but with pain in abduction and forward flexion + pain/weakness with testing SITS in ext/int rotation. + pain with empty can sign. Neg Yerguson, Speed test. + impingement. No pain with rotation of humeral head in [facility] joint.  Neurological: She is alert and oriented to person, place, and time.  Strength intact   Results for orders placed in visit on 02/26/14  TSH      Result Value Ref Range   TSH 0.46  0.35 - 4.50 uIU/mL  Assessment & Plan:   Problem List Items Addressed This Visit   Right shoulder pain - Primary     Exam consistent with shoulder bursitis and RTC tendonitis with impingement syndrome. Treat with Naprosyn x 1 wk then PRN, discussed take with food and GI precautions with use. Recommended ice/heat. Provided with SMpt advisor exercise for shoulder bursitis and RTC injury. Update if not improving to consider SM referral or steroid injection. Pt agrees with plan.        Follow up plan: Return if symptoms worsen or fail to improve.

## 2014-03-28 NOTE — Patient Instructions (Signed)
I think you have right shoulder bursitis as well as tendonitis of rotator cuff. Treat with naprosyn anti inflammatory for 1 week (take twice daily with food), heating pad or ice (whichever soothes better), and exercises provided today. Give me an update over next few weeks with effect of above treatment. If persistent pain I would recommend returning to see our sports medicine doctor for a formal exercise program.

## 2014-03-28 NOTE — Assessment & Plan Note (Addendum)
Exam consistent with shoulder bursitis and RTC tendonitis with impingement syndrome. Treat with Naprosyn x 1 wk then PRN, discussed take with food and GI precautions with use. Recommended ice/heat. Provided with SMpt advisor exercise for shoulder bursitis and RTC injury. Update if not improving to consider SM referral or steroid injection. Pt agrees with plan.

## 2014-03-28 NOTE — Progress Notes (Signed)
Pre visit review using our clinic review tool, if applicable. No additional management support is needed unless otherwise documented below in the visit note. 

## 2014-05-02 ENCOUNTER — Encounter: Payer: Self-pay | Admitting: Family Medicine

## 2014-05-02 NOTE — Telephone Encounter (Signed)
Opened note in daughter's chart.

## 2014-05-13 ENCOUNTER — Encounter: Payer: Self-pay | Admitting: Internal Medicine

## 2014-05-13 ENCOUNTER — Ambulatory Visit (INDEPENDENT_AMBULATORY_CARE_PROVIDER_SITE_OTHER): Admitting: Internal Medicine

## 2014-05-13 VITALS — BP 104/68 | HR 64 | Temp 97.7°F | Resp 12 | Wt 130.0 lb

## 2014-05-13 DIAGNOSIS — E890 Postprocedural hypothyroidism: Secondary | ICD-10-CM

## 2014-05-13 DIAGNOSIS — C73 Malignant neoplasm of thyroid gland: Secondary | ICD-10-CM

## 2014-05-13 LAB — TSH: TSH: 0.37 u[IU]/mL (ref 0.35–4.50)

## 2014-05-13 LAB — T4, FREE: Free T4: 1.28 ng/dL (ref 0.60–1.60)

## 2014-05-13 NOTE — Progress Notes (Signed)
Patient ID: Maria Ray, female   DOB: July 25, 1970, 43 y.o.   MRN: [id number]   HPI  Maria Ray is a 43 y.o.-year-old female, returning for f/u for papillary thyroid cancer and postsurgical hypothyroidism. Last visit 3 mo ago.  She again c/o HAs that restarted in last week. Also pain in R shoulder and now elbow.  Reviewed hx: She started to feel very tired, cluster HAs, SOB x 2, hoarseness for 2.5 years. She then saw Dr Danise Mina on 07/2013 >> nodule palpated >> sent for thyroid U/S >> MNG >> the dominant nodule was Bx'd >> Thy CA >> sent for surgery (Dr Coralie Keens) >> 10/09/2013 >> PTC - 3 foci, metastatic in 1/1 LN.   ThyCa history: - 08/27/2013: Thyroid U/S [facility]):   Right lobe: 0.9 x 0.9 x 1 cm hypoechoic solid-appearing nodule with calcifications in upper pole. Solid-appearing nodule with calcifications measuring 2.4 x 1.5 x 2 cm mid pole. 0.7 x 0.5 x 0.4 cm complex appearing nodule in the lower pole  Left lobes: No nodules visualized  Isthmus: No nodules visualized  Lymphadenopathy: None visualized - 08/30/2013: Thyroid nodule biopsy [facility]): findings consistent with papillary carcinoma - 10/09/2013: Total thyroidectomy by Dr. Ninfa Linden - PAPILLARY THYROID CARCINOMA, MULTIPLE FOCI, THE LARGEST SPANS 2.5 CM, CONFINED TO THE THYROID. - LYMPHOVASCULAR INVASION IS IDENTIFIED. - ADENOCARCINOMA IS BROADLY LESS THAN 0.1 MM TO THE INKED MARGIN OF THE RIGHT MIDDLE LOBE. - PARATHYROID TISSUE (NEAR RIGHT LOWER LOBE). Lymph node, biopsy, Right lateral compartmental - METASTATIC PAPILLARY THYROID CARCINOMA IN 1 OF 1 LYMPH NODE (1/1). Tumor focality: Multiple foci (at least 4 grossly identified foci) Dominant tumor: Maximum tumor size (cm): 2.5 cm (gross measurement) Tumor laterality: Right lower lobe Histologic type: Papillary thyroid carcinoma Tumor capsule: Present Extrathyroidal extension: Not identified Margins: Negative for carcinoma Lymph - Vascular invasion: Present Capsular  invasion with degree of invasion if present: Not identified Second largest tumor: Tumor size(s): 1.0 cm (gross measurement) Tumor laterality: Right upper pole Histologic type : Papillary thyroid carcinoma Tumor capsule: Present Extrathyroidal extension: Not identified Margins: Negative for carcinoma Lymph - Vascular invasion: Present Capsular invasion with degree of invasion if present: Not identified  Lymph nodes: # examined 1; # positive; 1 TNM code: mpT2, pN1 Non-neoplastic thyroid: Lymphocytic thyroiditis  Comments: There are at least four grossly identified foci of papillary thyroid carcinoma, the largest spanning 2.5 cm. There is associated lymphovascular invasion. The 0.6 cm focus of carcinoma in the right middle lobe is broadly less than 0.1 cm from the inked resection margin. - 10/11/2013: Started Levothyroxine 100 mcg  - 01/16/2014: RAI tx 119 mCi I131 - 01/25/2014: post-Tx WBS:  1. Expected remnant activity in the neck. Potential small lymph node metastasis.  2. No evidence of distant metastasis.  We had problems allowing her the TSH to rise after stopping the Synthroid in preparation for the RAI tx (she was traveling) she was off the med for longer >> TSH increased to 52.  She takes Synthroid 100 mcg - restarted: - fasting - with water - separated by >30 min from b'fast  - no calcium, iron, multivitamins, PPI  I reviewed pt's latest thyroid tests: Lab Results  Component Value Date   TSH 0.46 02/26/2014   TSH 52.17* 01/14/2014   TSH 18.71* 12/21/2013   TSH 16.15* 12/14/2013   TSH 0.48 11/16/2013   TSH 2.390 01/15/2013   FREET4 0.17* 01/14/2014   FREET4 1.29 11/16/2013    She had dysphagia postop >> saw ENT >>  now resolved; was told her vocal cords are inflamed >> advised to start Tx for GERD >> no result. Still raspy voice and dry throat sensation.  Pt c/o: - + better fatigue - + HAs (frontal) - + cold/heat intolerance - no weight gain - no  constipation - no dry skin - no hair falling - no depression  I reviewed pt's medications, allergies, PMH, social hx, family hx and no changes required, except as mentioned above.  ROS: Constitutional: no weight gain/loss, + fatigue,  No subjective hyperthermia/hypothermia Eyes: no blurry vision, no xerophthalmia ENT: no sore throat, no nodules palpated in throat, no dysphagia/odynophagia, + hoarseness Cardiovascular: no CP/SOB/palpitations/leg swelling Respiratory: no cough/SOB Gastrointestinal: no N/V/D/C Musculoskeletal: no muscle aches/no joint aches Skin: no rashes Neurological: no tremors/numbness/tingling/dizziness, + HA   PE: BP 104/68 mmHg  Pulse 64  Temp(Src) 97.7 F (36.5 C) (Oral)  Resp 12  Wt 130 lb (58.968 kg)  SpO2 98% Wt Readings from Last 3 Encounters:  05/13/14 130 lb (58.968 kg)  03/28/14 125 lb 8 oz (56.926 kg)  02/11/14 128 lb (58.06 kg)   Constitutional: normal weight, in NAD Eyes: PERRLA, EOMI, no exophthalmos ENT: moist mucous membranes, cervical scar healing, no dysesthesia, no cervical lymphadenopathy Cardiovascular: RRR, No MRG Respiratory: CTA B Gastrointestinal: abdomen soft, NT, ND, BS+ Musculoskeletal: no deformities, strength intact in all 4 Skin: moist, warm, no rashes Neurological: no tremor with outstretched hands, DTR normal in all 4  ASSESSMENT: 1. PTC (palpillary ThyCA) - stage 1, 2/2 age <56 y/o  2. Postsurgical Hypothyroidism  PLAN:  1. And 2. Patient with PTC, s/p thyroidectomy by Dr Ninfa Linden - She does not appear to have pain or discomfort at the surgical site, wears a scar reducing band-aid - has some dry throat/pressure in throat/raspy voice - she does not want to see a voice specialist right now, hopes this will improve - we discussed follow-up - we will plan to repeat a WBS in 1 year after the previous, Thyrogen-stimulated. - will check a Tg + ATA (explained the fact that this is a tumor marker)  2. Iatrogenic  hypothyroidism - We discussed about correct intake of levothyroxine, fasting, with water, separated by at least 30 minutes from breakfast, and separated by more than 4 hours from calcium, iron, multivitamins, acid reflux medications (PPIs). She is taking it correctly.  - Continue 100 mcg Levothyroxine >> we'll check thyroid tests now TSH, free T4 (ordered) - we need her TSH a little lower than the LLN for the first year after surgery Return in about 6 months (around 11/12/2014).   Office Visit on 05/13/2014  Component Date Value Ref Range Status  . Free T4 05/13/2014 1.28  0.60 - 1.60 ng/dL Final  . TSH 05/13/2014 0.37  0.35 - 4.50 uIU/mL Final  . Thyroglobulin 05/13/2014 0.1* 2.8 - 40.9 ng/mL Final   Comment: Thyroglobulin antibodies (TGAb) interfere with Thyroglobulin (TG) assays; therefore, Thyroglobulin antibody (TGAb) assay should always be performed in conjunction with a Thyroglobulin (TG) assay.   This test was performed using the Beckman Coulter chemiluminescent method.  Values obtained from different assay methods cannot be used interchangeably.  Thyroglobulin levels, regardless of value, should not be interpreted as absolute evidence of the presence or absence of disease.   **Please note change in methodology. If re-baselining is needed, for patients who are being serially tested, please call customer service, within 3 days of collection, to request to add on the appropriate re-baselining test code for the previous methodology, at  no charge.**   . Thyroglobulin Ab 05/13/2014 <1  <2 IU/mL Final   Comment:   **Please note change in methodology. If re-baselining is needed, for patients who are being serially tested, please call customer service, within 3 days of collection, to request to add on the appropriate re-baselining test code for the previous methodology, at no charge.**    Excellent results!

## 2014-05-13 NOTE — Patient Instructions (Signed)
Please return in 6 months. ? ?Please stop at the lab. ? ? ?

## 2014-05-14 ENCOUNTER — Other Ambulatory Visit: Payer: Self-pay | Admitting: Internal Medicine

## 2014-05-14 DIAGNOSIS — E890 Postprocedural hypothyroidism: Secondary | ICD-10-CM

## 2014-05-14 LAB — THYROGLOBULIN LEVEL: Thyroglobulin: 0.1 ng/mL — ABNORMAL LOW (ref 2.8–40.9)

## 2014-05-14 LAB — THYROGLOBULIN ANTIBODY

## 2014-05-14 MED ORDER — LEVOTHYROXINE SODIUM 100 MCG PO TABS: 100.0000 ug | ORAL_TABLET | Freq: Every day | ORAL | Status: DC

## 2014-06-24 ENCOUNTER — Ambulatory Visit (INDEPENDENT_AMBULATORY_CARE_PROVIDER_SITE_OTHER): Admitting: Family Medicine

## 2014-06-24 ENCOUNTER — Encounter: Payer: Self-pay | Admitting: Family Medicine

## 2014-06-24 VITALS — BP 120/72 | HR 68 | Temp 98.1°F | Resp 16 | Ht 67.0 in | Wt 125.4 lb

## 2014-06-24 DIAGNOSIS — M7711 Lateral epicondylitis, right elbow: Secondary | ICD-10-CM | POA: Insufficient documentation

## 2014-06-24 DIAGNOSIS — E890 Postprocedural hypothyroidism: Secondary | ICD-10-CM

## 2014-06-24 MED ORDER — NAPROXEN 500 MG PO TABS: ORAL_TABLET | ORAL | Status: DC

## 2014-06-24 NOTE — Assessment & Plan Note (Signed)
Stable on current regimen - continue. TSH at goal.

## 2014-06-24 NOTE — Assessment & Plan Note (Signed)
Treat with naprosyn course, tennis elbow strap provided today, as well as exercises from [facility] pt advisor. rec no heavy lifting or repetitive hand motion for 2 weeks, as well as regular use of strap for 2 weeks. Update if not improving as expected. Pt agrees with plan.

## 2014-06-24 NOTE — Progress Notes (Signed)
BP 120/72 mmHg  Pulse 68  Temp(Src) 98.1 F (36.7 C) (Oral)  Resp 16  Ht 5\' 7"  (1.702 m)  Wt 125 lb 6.4 oz (56.881 kg)  BMI 19.64 kg/m2  SpO2 97%   CC: 4 mo f/u visit  Subjective:    Patient ID: Maria Ray, female    DOB: [birth year], 44 y.o.   MRN: [id number]  HPI: Maria Ray is a 44 y.o. female presenting on 06/24/2014 for Follow-up   Papillary thyroid carcinoma s/p thyroidectomy and then radiotherapy 01/16/2014. Postsurgical hypothyroidism on levothyroxine 166mcg daily.  Reviewed Dr. Arman Filter latest note. Last seen 04/2014. F/u planned 10/2014. Goal thyroid treatment with TSH at lower limit normal for first year. Last TSH 0.37.   HAs have returned. Not as intense as previously but . Flexeril has caused sickness.   R shoulder pain improved but now moving to R elbow. Previously treated for RTC tendonitis and shoulder bursitis with naprosyn.  Completed 1 mo course. Did not tolerate flexeril in the past. Now describes lateral elbow pain even to touch. She does do repetitive motions at work - some lifting. Ongoing discomfort over last few months.  2 yrs ago did injury R arm - fall into warehouse, broke thumb s/p brace. R arm pain has improved using copper bracelet.   R handed.  Relevant past medical, surgical, family and social history reviewed and updated as indicated. Interim medical history since our last visit reviewed. Allergies and medications reviewed and updated. Current Outpatient Prescriptions on File Prior to Visit  Medication Sig  . Aspirin-Acetaminophen-Caffeine (EXCEDRIN PO) Take 1 tablet by mouth as needed.  . Biotin (BIOTIN MAXIMUM STRENGTH) 10 MG TABS Take 1 tablet by mouth daily.  . cholecalciferol (VITAMIN D) 1000 UNITS tablet Take 1,000 Units by mouth daily.  Marland Kitchen GARLIC 6967 PO Take 1 capsule by mouth daily.  . Ginkgo Biloba (GINKOBA PO) Take 1 capsule by mouth daily.  Marland Kitchen KRILL OIL PO Take by mouth.  . levothyroxine (SYNTHROID, LEVOTHROID) 100 MCG tablet Take  1 tablet (100 mcg total) by mouth daily.  . Multiple Vitamin (MULTIVITAMIN) tablet Take 1 tablet by mouth daily.   No current facility-administered medications on file prior to visit.    Review of Systems Per HPI unless specifically indicated above     Objective:    BP 120/72 mmHg  Pulse 68  Temp(Src) 98.1 F (36.7 C) (Oral)  Resp 16  Ht 5\' 7"  (1.702 m)  Wt 125 lb 6.4 oz (56.881 kg)  BMI 19.64 kg/m2  SpO2 97%  Wt Readings from Last 3 Encounters:  06/24/14 125 lb 6.4 oz (56.881 kg)  05/13/14 130 lb (58.968 kg)  03/28/14 125 lb 8 oz (56.926 kg)    Physical Exam  Constitutional: She appears well-developed and well-nourished. No distress.  Musculoskeletal: She exhibits no edema.  No pain at shoulders, FROM. Tender to palpation at R lateral epicondyle, worse pain with pronation and wrist extension against resistance FROM in flexion/extension at elbows  Skin: Skin is warm and dry. No rash noted.  Psychiatric: She has a normal mood and affect.  Vitals reviewed.  Results for orders placed or performed in visit on 05/13/14  T4, free  Result Value Ref Range   Free T4 1.28 0.60 - 1.60 ng/dL  TSH  Result Value Ref Range   TSH 0.37 0.35 - 4.50 uIU/mL  Thyroglobulin  Result Value Ref Range   Thyroglobulin 0.1 (L) 2.8 - 40.9 ng/mL  Thyroglobulin antibody  Result Value  Ref Range   Thyroglobulin Ab <1 <2 IU/mL      Assessment & Plan:   Problem List Items Addressed This Visit    Postsurgical hypothyroidism    Stable on current regimen - continue. TSH at goal.      Lateral epicondylitis of right elbow - Primary    Treat with naprosyn course, tennis elbow strap provided today, as well as exercises from [facility] pt advisor. rec no heavy lifting or repetitive hand motion for 2 weeks, as well as regular use of strap for 2 weeks. Update if not improving as expected. Pt agrees with plan.      Relevant Medications   naproxen (NAPROSYN) tablet       Follow up plan: Return in  about 6 months (around 12/23/2014), or if symptoms worsen or fail to improve, for annual exam, prior fasting for blood work.

## 2014-06-24 NOTE — Progress Notes (Signed)
Pre visit review using our clinic review tool, if applicable. No additional management support is needed unless otherwise documented below in the visit note. 

## 2014-06-24 NOTE — Patient Instructions (Addendum)
I think this is tennis elbow or lateral epicondylitis. Treat with naprosyn course (daily for 5 days with food then as needed for next several weeks), bracing to offload forces at lateral elbow, and exercises provided. Avoid repetitive hand motions or heavy lifting for 2 weeks. Let us know if not improving as expected. Good to see you today, call us with questions. Return in 4-6 months for physical.

## 2014-08-27 ENCOUNTER — Other Ambulatory Visit: Payer: Self-pay | Admitting: Family Medicine

## 2014-08-27 DIAGNOSIS — Z13220 Encounter for screening for lipoid disorders: Secondary | ICD-10-CM

## 2014-08-27 DIAGNOSIS — C73 Malignant neoplasm of thyroid gland: Secondary | ICD-10-CM

## 2014-08-27 DIAGNOSIS — E890 Postprocedural hypothyroidism: Secondary | ICD-10-CM

## 2014-08-28 ENCOUNTER — Other Ambulatory Visit (INDEPENDENT_AMBULATORY_CARE_PROVIDER_SITE_OTHER)

## 2014-08-28 DIAGNOSIS — C73 Malignant neoplasm of thyroid gland: Secondary | ICD-10-CM | POA: Diagnosis not present

## 2014-08-28 DIAGNOSIS — E890 Postprocedural hypothyroidism: Secondary | ICD-10-CM

## 2014-08-28 DIAGNOSIS — Z13220 Encounter for screening for lipoid disorders: Secondary | ICD-10-CM

## 2014-08-28 LAB — CBC WITH DIFFERENTIAL/PLATELET
Basophils Absolute: 0 10*3/uL (ref 0.0–0.1)
Basophils Relative: 0.3 % (ref 0.0–3.0)
Eosinophils Absolute: 0.1 10*3/uL (ref 0.0–0.7)
Eosinophils Relative: 0.9 % (ref 0.0–5.0)
HEMATOCRIT: 41.9 % (ref 36.0–46.0)
HEMOGLOBIN: 14.2 g/dL (ref 12.0–15.0)
LYMPHS PCT: 17.9 % (ref 12.0–46.0)
Lymphs Abs: 1.1 10*3/uL (ref 0.7–4.0)
MCHC: 33.8 g/dL (ref 30.0–36.0)
MCV: 92.1 fl (ref 78.0–100.0)
MONOS PCT: 7.3 % (ref 3.0–12.0)
Monocytes Absolute: 0.5 10*3/uL (ref 0.1–1.0)
Neutro Abs: 4.6 10*3/uL (ref 1.4–7.7)
Neutrophils Relative %: 73.6 % (ref 43.0–77.0)
PLATELETS: 264 10*3/uL (ref 150.0–400.0)
RBC: 4.55 Mil/uL (ref 3.87–5.11)
RDW: 12.8 % (ref 11.5–15.5)
WBC: 6.3 10*3/uL (ref 4.0–10.5)

## 2014-08-28 LAB — COMPREHENSIVE METABOLIC PANEL
ALK PHOS: 86 U/L (ref 39–117)
ALT: 55 U/L — ABNORMAL HIGH (ref 0–35)
AST: 38 U/L — ABNORMAL HIGH (ref 0–37)
Albumin: 4.4 g/dL (ref 3.5–5.2)
BUN: 8 mg/dL (ref 6–23)
CO2: 30 mEq/L (ref 19–32)
CREATININE: 0.86 mg/dL (ref 0.40–1.20)
Calcium: 9.8 mg/dL (ref 8.4–10.5)
Chloride: 103 mEq/L (ref 96–112)
GFR: 75.99 mL/min (ref >60.00)
GLUCOSE: 94 mg/dL (ref 70–99)
Potassium: 4.1 mEq/L (ref 3.5–5.1)
Sodium: 137 mEq/L (ref 135–145)
TOTAL PROTEIN: 7.4 g/dL (ref 6.0–8.3)
Total Bilirubin: 0.5 mg/dL (ref 0.2–1.2)

## 2014-08-28 LAB — LIPID PANEL
CHOLESTEROL: 143 mg/dL (ref 0–200)
HDL: 56.9 mg/dL (ref >39.00)
LDL CALC: 64 mg/dL (ref 0–99)
NonHDL: 86.1
Total CHOL/HDL Ratio: 3
Triglycerides: 113 mg/dL (ref 0.0–149.0)
VLDL: 22.6 mg/dL (ref 0.0–40.0)

## 2014-08-28 LAB — TSH: TSH: 0.31 u[IU]/mL — ABNORMAL LOW (ref 0.35–4.50)

## 2014-08-30 ENCOUNTER — Encounter: Payer: Self-pay | Admitting: Family Medicine

## 2014-08-30 ENCOUNTER — Ambulatory Visit (INDEPENDENT_AMBULATORY_CARE_PROVIDER_SITE_OTHER): Admitting: Family Medicine

## 2014-08-30 VITALS — BP 110/64 | HR 72 | Temp 98.0°F | Ht 67.0 in | Wt 130.1 lb

## 2014-08-30 DIAGNOSIS — M545 Low back pain, unspecified: Secondary | ICD-10-CM | POA: Insufficient documentation

## 2014-08-30 DIAGNOSIS — R002 Palpitations: Secondary | ICD-10-CM

## 2014-08-30 DIAGNOSIS — R5382 Chronic fatigue, unspecified: Secondary | ICD-10-CM

## 2014-08-30 DIAGNOSIS — Z0000 Encounter for general adult medical examination without abnormal findings: Secondary | ICD-10-CM | POA: Diagnosis not present

## 2014-08-30 DIAGNOSIS — E890 Postprocedural hypothyroidism: Secondary | ICD-10-CM

## 2014-08-30 DIAGNOSIS — R740 Nonspecific elevation of levels of transaminase and lactic acid dehydrogenase [LDH]: Secondary | ICD-10-CM

## 2014-08-30 DIAGNOSIS — R5383 Other fatigue: Secondary | ICD-10-CM | POA: Insufficient documentation

## 2014-08-30 DIAGNOSIS — C73 Malignant neoplasm of thyroid gland: Secondary | ICD-10-CM

## 2014-08-30 DIAGNOSIS — R7401 Elevation of levels of liver transaminase levels: Secondary | ICD-10-CM | POA: Insufficient documentation

## 2014-08-30 DIAGNOSIS — F5104 Psychophysiologic insomnia: Secondary | ICD-10-CM

## 2014-08-30 NOTE — Assessment & Plan Note (Signed)
S/p thyroidectomy then radioactive iodine therapy.

## 2014-08-30 NOTE — Assessment & Plan Note (Signed)
Preventative protocols reviewed and updated unless pt declined. Discussed healthy diet and lifestyle.  

## 2014-08-30 NOTE — Assessment & Plan Note (Signed)
TSH borderline low - but as desired post radioiodine therapy. No changes today.

## 2014-08-30 NOTE — Assessment & Plan Note (Signed)
Again, ?hyperthyroid related. Recheck in 4-6 wks.

## 2014-08-30 NOTE — Addendum Note (Signed)
Addended by: Ria Bush on: 08/30/2014 02:03 PM   Modules accepted: Miquel Dunn

## 2014-08-30 NOTE — Progress Notes (Signed)
BP 110/64 mmHg  Pulse 72  Temp(Src) 98 F (36.7 C) (Oral)  Ht 5\' 7"  (1.702 m)  Wt 130 lb 2 oz (59.024 kg)  BMI 20.38 kg/m2   CC: CPE  Subjective:    Patient ID: Maria Ray, female    DOB: [birth year], 44 y.o.   MRN: [id number]  HPI: Maria Ray is a 44 y.o. 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   Daughter just dx with MTHFR2 disease. Asks about implications for herself and her daughter.  Early march - woke up with lower back pain. Describes sharp pain in lower back without radiation. Took naprosyn. Planning on starting low impact exercises. No long with any back pain. Occasional L medial ankle pain, without numbness or weakness of ankle.   Mild weight gain noted recently.   Palpitations - recently increased. Associated with mild dyspnea. Notices more at night time. Has happened last 2 nights. No tachycardia or skipped beats or irregularity - just feels louder/harder than normal. No chest pain.   Increasing fatigue recently. Off work last 2 weeks - spent time with family in [place].   Trouble sleeping - chronically. Averages 5 hours sleep. Has tried OTC remedies such as melatonin, benadryl.   Also - cyst L index finger DIP, under R distal clavicle. Get sore occasionally.   Papillary thyroid carcinoma s/p thyroidectomy and then radiotherapy 01/16/2014. Postsurgical hypothyroidism on levothyroxine 154mcg daily.   Preventative: Well woman - stopped after hysterectomy 08/2008.  Mammo - declines. Does do breast exams at home.  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 Interim  medical history since our last visit reviewed. Allergies and medications reviewed and updated. Current Outpatient Prescriptions on File Prior to Visit  Medication Sig  . Aspirin-Acetaminophen-Caffeine (EXCEDRIN PO) Take 1 tablet by mouth as needed.  . Biotin (BIOTIN MAXIMUM STRENGTH) 10 MG TABS Take 1 tablet by mouth daily.  . cholecalciferol (VITAMIN D) 1000 UNITS tablet Take 1,000 Units by mouth daily.  Marland Kitchen GARLIC 0867 PO Take 1 capsule by mouth daily.  . Ginkgo Biloba (GINKOBA PO) Take 1 capsule by mouth daily.  Marland Kitchen KRILL OIL PO Take by mouth.  . levothyroxine (SYNTHROID, LEVOTHROID) 100 MCG tablet Take 1 tablet (100 mcg total) by mouth daily.  . naproxen (NAPROSYN) 500 MG tablet Take one po bid x 5 days then prn pain, take with food   No current facility-administered medications on file prior to visit.   Past Medical History  Diagnosis Date  . Thyroid nodule 07/2013    right  . Allergic rhinitis   . Papillary thyroid carcinoma 09/03/2013    s/p thyroidectomy then radioiodine ablation per endo  . Migraine     "maybe a couple times/yr" (10/09/2013)  . Cluster headache   . Hoarseness of voice 01/2014    after thyroidectomy s/p ENT eval ?GERD - did not improve w/ PPI trial    Past Surgical History  Procedure Laterality Date  . Mri  2010    brain WNL per patient  . Tympanostomy  child  . Thyroidectomy, partial  10/09/2013    "really a total but called it subtotal  cause he left 1 cell"  . Tonsillectomy and adenoidectomy  1976  . Carpal tunnel with cubital tunnel Bilateral 2004-2006  . Abdominal hysterectomy  2010    partial; heavy bleeding, ovaries remain  . Reduction mammaplasty  1990  . Thyroidectomy N/A 10/09/2013    Procedure: THYROIDECTOMY SUBTOTAL;  Surgeon: Harl Bowie, MD    Review of Systems  Constitutional: Positive for appetite change (no appetite). Negative for fever, chills, activity change, fatigue and unexpected weight change.  HENT: Negative for hearing loss.     Eyes: Negative for visual disturbance (to schedule eye exam).  Respiratory: Negative for cough, chest tightness, shortness of breath and wheezing.   Cardiovascular: Positive for palpitations (louder beats). Negative for chest pain and leg swelling.  Gastrointestinal: Negative for nausea, vomiting, abdominal pain, diarrhea, constipation, blood in stool and abdominal distention.  Genitourinary: Negative for hematuria and difficulty urinating.  Musculoskeletal: Negative for myalgias, arthralgias and neck pain.  Skin: Negative for rash.  Neurological: Positive for dizziness (mild occasionally) and headaches (MVI related - stopped this and headaches improved, also with sinus headaches treated with tylenol sinus). Negative for seizures and syncope.  Hematological: Negative for adenopathy. Does not bruise/bleed easily.  Psychiatric/Behavioral: Negative for dysphoric mood. The patient is nervous/anxious (a bit anxious about health).    Per HPI unless specifically indicated above     Objective:    BP 110/64 mmHg  Pulse 72  Temp(Src) 98 F (36.7 C) (Oral)  Ht 5\' 7"  (1.702 m)  Wt 130 lb 2 oz (59.024 kg)  BMI 20.38 kg/m2  Wt Readings from Last 3 Encounters:  08/30/14 130 lb 2 oz (59.024 kg)  06/24/14 125 lb 6.4 oz (56.881 kg)  05/13/14 130 lb (58.968 kg)    Physical Exam  Constitutional: She is oriented to person, place, and time. She appears well-developed and well-nourished. No distress.  HENT:  Head: Normocephalic and atraumatic.  Right Ear: Hearing, tympanic membrane, external ear and ear canal normal.  Left Ear: Hearing, tympanic membrane, external ear and ear canal normal.  Nose: Nose normal.  Mouth/Throat: Uvula is midline, oropharynx is clear and moist and mucous membranes are normal. No oropharyngeal exudate, posterior oropharyngeal edema or posterior oropharyngeal erythema.  Eyes: Conjunctivae and EOM are normal. Pupils are equal, round, and reactive to light. No scleral icterus.   Neck: Normal range of motion. Neck supple. No thyromegaly present.  Scar mid neck present  Cardiovascular: Normal rate, regular rhythm, normal heart sounds and intact distal pulses.   No murmur heard. Pulses:      Radial pulses are 2+ on the right side, and 2+ on the left side.  Pulmonary/Chest: Effort normal and breath sounds normal. No respiratory distress. She has no wheezes. She has no rales. She exhibits no tenderness. Right breast exhibits no inverted nipple, no mass, no nipple discharge, no skin change and no tenderness. Left breast exhibits no inverted nipple, no mass, no nipple discharge, no skin change and no tenderness.  Abdominal: Soft. Bowel sounds are normal. She exhibits no distension and no mass. There is no tenderness. There is no rebound and no guarding.  Musculoskeletal: Normal range of motion. She exhibits no edema.  Lymphadenopathy:       Head (right side): No submental, no submandibular, no tonsillar, no preauricular and no posterior auricular adenopathy present.       Head (left side): No submental, no submandibular, no tonsillar, no preauricular and no posterior auricular adenopathy present.    She has no cervical  adenopathy.    She has no axillary adenopathy.       Right axillary: No lateral adenopathy present.       Left axillary: No lateral adenopathy present.      Right: No supraclavicular adenopathy present.       Left: No supraclavicular adenopathy present.  Neurological: She is alert and oriented to person, place, and time.  CN grossly intact, station and gait intact  Skin: Skin is warm and dry. No rash noted.  Psychiatric: She has a normal mood and affect. Her behavior is normal. Judgment and thought content normal.  Nursing note and vitals reviewed.  Results for orders placed or performed in visit on 08/28/14  Lipid panel  Result Value Ref Range   Cholesterol 143 0 - 200 mg/dL   Triglycerides 113.0 0.0 - 149.0 mg/dL   HDL 56.90 >39.00 mg/dL   VLDL 22.6  0.0 - 40.0 mg/dL   LDL Cholesterol 64 0 - 99 mg/dL   Total CHOL/HDL Ratio 3    NonHDL 86.10   CBC with Differential/Platelet  Result Value Ref Range   WBC 6.3 4.0 - 10.5 K/uL   RBC 4.55 3.87 - 5.11 Mil/uL   Hemoglobin 14.2 12.0 - 15.0 g/dL   HCT 41.9 36.0 - 46.0 %   MCV 92.1 78.0 - 100.0 fl   MCHC 33.8 30.0 - 36.0 g/dL   RDW 12.8 11.5 - 15.5 %   Platelets 264.0 150.0 - 400.0 K/uL   Neutrophils Relative % 73.6 43.0 - 77.0 %   Lymphocytes Relative 17.9 12.0 - 46.0 %   Monocytes Relative 7.3 3.0 - 12.0 %   Eosinophils Relative 0.9 0.0 - 5.0 %   Basophils Relative 0.3 0.0 - 3.0 %   Neutro Abs 4.6 1.4 - 7.7 K/uL   Lymphs Abs 1.1 0.7 - 4.0 K/uL   Monocytes Absolute 0.5 0.1 - 1.0 K/uL   Eosinophils Absolute 0.1 0.0 - 0.7 K/uL   Basophils Absolute 0.0 0.0 - 0.1 K/uL  Comprehensive metabolic panel  Result Value Ref Range   Sodium 137 135 - 145 mEq/L   Potassium 4.1 3.5 - 5.1 mEq/L   Chloride 103 96 - 112 mEq/L   CO2 30 19 - 32 mEq/L   Glucose, Bld 94 70 - 99 mg/dL   BUN 8 6 - 23 mg/dL   Creatinine, Ser 0.86 0.40 - 1.20 mg/dL   Total Bilirubin 0.5 0.2 - 1.2 mg/dL   Alkaline Phosphatase 86 39 - 117 U/L   AST 38 (H) 0 - 37 U/L   ALT 55 (H) 0 - 35 U/L   Total Protein 7.4 6.0 - 8.3 g/dL   Albumin 4.4 3.5 - 5.2 g/dL   Calcium 9.8 8.4 - 10.5 mg/dL   GFR 75.99 >60.00 mL/min  TSH  Result Value Ref Range   TSH 0.31 (L) 0.35 - 4.50 uIU/mL      Assessment & Plan:   Problem List Items Addressed This Visit    Transaminitis    Again, ?hyperthyroid related. Recheck in 4-6 wks.      Postsurgical hypothyroidism    TSH borderline low - but as desired post radioiodine therapy. No changes today.      Papillary thyroid carcinoma    S/p thyroidectomy then radioactive iodine therapy.      Palpitations    Actually sound like loud heart beats, no tachyarrhythmia. Check EKG for baseline today. ?mild hyperthyroidism related - will have pt check with endo re this. No changes today.  Relevant Orders   EKG 12-Lead   LBP (low back pain)    Anticipate lumbar strain vs bulging disc flare. rec come in when in active flare for evaluation. Encouraged low impact exercise and provided with LBP stretching exercises from [facility] pt advisor today.      Health maintenance examination - Primary    Preventative protocols reviewed and updated unless pt declined. Discussed healthy diet and lifestyle.       Fatigue    Anticipate fatigued from poor sleep and poor diet habits. Discussed importance of nutritional status and healthy sleeping habits.      Chronic insomnia    Discuss further at next visit. Consider med trial - but pt very sensitive to meds in general.           Follow up plan: Return in about 3 months (around 11/29/2014), or as needed, for follow up visit.

## 2014-08-30 NOTE — Assessment & Plan Note (Signed)
Anticipate lumbar strain vs bulging disc flare. rec come in when in active flare for evaluation. Encouraged low impact exercise and provided with LBP stretching exercises from  [facility] pt advisor today.

## 2014-08-30 NOTE — Patient Instructions (Addendum)
EKG today Return for lab visit in 4-6 wks to recheck liver function.  Return in 3 months for follow up visit. Good to see you today, call us with questions. Return when finger in flare or lower back in flare for evaluation. I agree with starting low impact exercise, look at stretching exercises provided today for lower back

## 2014-08-30 NOTE — Assessment & Plan Note (Signed)
Anticipate fatigued from poor sleep and poor diet habits. Discussed importance of nutritional status and healthy sleeping habits.

## 2014-08-30 NOTE — Assessment & Plan Note (Addendum)
Discuss further at next visit. Consider med trial - but pt very sensitive to meds in general.

## 2014-08-30 NOTE — Assessment & Plan Note (Addendum)
Actually sound like loud heart beats, no tachyarrhythmia. Check EKG for baseline today. ?mild hyperthyroidism related - will have pt check with endo re this. No changes today. EKG - NSR rate 60s, normal axis, intervals, no acute ST/T changes.

## 2014-08-30 NOTE — Progress Notes (Signed)
Pre visit review using our clinic review tool, if applicable. No additional management support is needed unless otherwise documented below in the visit note. 

## 2014-09-27 ENCOUNTER — Other Ambulatory Visit: Payer: Self-pay | Admitting: Family Medicine

## 2014-09-27 DIAGNOSIS — R740 Nonspecific elevation of levels of transaminase and lactic acid dehydrogenase [LDH]: Principal | ICD-10-CM

## 2014-09-27 DIAGNOSIS — R7401 Elevation of levels of liver transaminase levels: Secondary | ICD-10-CM

## 2014-10-01 ENCOUNTER — Encounter: Payer: Self-pay | Admitting: Family Medicine

## 2014-10-01 DIAGNOSIS — C73 Malignant neoplasm of thyroid gland: Secondary | ICD-10-CM

## 2014-10-01 DIAGNOSIS — R5382 Chronic fatigue, unspecified: Secondary | ICD-10-CM

## 2014-10-03 ENCOUNTER — Encounter: Payer: Self-pay | Admitting: Family Medicine

## 2014-10-04 ENCOUNTER — Other Ambulatory Visit

## 2014-10-04 ENCOUNTER — Other Ambulatory Visit (INDEPENDENT_AMBULATORY_CARE_PROVIDER_SITE_OTHER)

## 2014-10-04 DIAGNOSIS — R7402 Elevation of levels of lactic acid dehydrogenase (LDH): Secondary | ICD-10-CM

## 2014-10-04 DIAGNOSIS — R5382 Chronic fatigue, unspecified: Secondary | ICD-10-CM | POA: Diagnosis not present

## 2014-10-04 DIAGNOSIS — R740 Nonspecific elevation of levels of transaminase and lactic acid dehydrogenase [LDH]: Secondary | ICD-10-CM

## 2014-10-04 DIAGNOSIS — C73 Malignant neoplasm of thyroid gland: Secondary | ICD-10-CM | POA: Diagnosis not present

## 2014-10-04 LAB — HEPATIC FUNCTION PANEL
ALBUMIN: 4.2 g/dL (ref 3.5–5.2)
ALT: 32 U/L (ref 0–35)
AST: 34 U/L (ref 0–37)
Alkaline Phosphatase: 58 U/L (ref 39–117)
Bilirubin, Direct: 0.2 mg/dL (ref 0.0–0.3)
Total Bilirubin: 0.6 mg/dL (ref 0.2–1.2)
Total Protein: 7.3 g/dL (ref 6.0–8.3)

## 2014-10-04 LAB — T4, FREE: Free T4: 1.31 ng/dL (ref 0.60–1.60)

## 2014-10-04 LAB — VITAMIN B12: VITAMIN B 12: 673 pg/mL (ref 211–911)

## 2014-10-04 LAB — VITAMIN D 25 HYDROXY (VIT D DEFICIENCY, FRACTURES): VITD: 33.69 ng/mL (ref 30.00–100.00)

## 2014-10-07 NOTE — Telephone Encounter (Signed)
See Mychart message from patient.

## 2014-11-07 ENCOUNTER — Encounter: Payer: Self-pay | Admitting: Internal Medicine

## 2014-11-08 ENCOUNTER — Ambulatory Visit (INDEPENDENT_AMBULATORY_CARE_PROVIDER_SITE_OTHER): Admitting: Internal Medicine

## 2014-11-08 ENCOUNTER — Encounter: Payer: Self-pay | Admitting: Internal Medicine

## 2014-11-08 VITALS — BP 102/64 | HR 97 | Temp 97.6°F | Resp 12 | Wt 128.2 lb

## 2014-11-08 DIAGNOSIS — E890 Postprocedural hypothyroidism: Secondary | ICD-10-CM | POA: Diagnosis not present

## 2014-11-08 DIAGNOSIS — C73 Malignant neoplasm of thyroid gland: Secondary | ICD-10-CM

## 2014-11-08 DIAGNOSIS — R5382 Chronic fatigue, unspecified: Secondary | ICD-10-CM

## 2014-11-08 NOTE — Patient Instructions (Signed)
Please come back for labs on Monday (off biotin for >24h).  Please come back for a follow-up appointment in 6 months.  I hope you feel better soon.

## 2014-11-08 NOTE — Progress Notes (Signed)
Patient ID: Maria Ray, female   DOB: 1970-10-06, 44 y.o.   MRN: [id number]   HPI  Maria Ray is a 44 y.o.-year-old female, returning for f/u for papillary thyroid cancer and postsurgical hypothyroidism. Last visit 7 mo ago.  Reviewed hx: She started to feel very tired, cluster HAs, SOB x 2, hoarseness for 2.5 years. She then saw Dr Danise Mina on 07/2013 >> nodule palpated >> sent for thyroid U/S >> [facility] >> the dominant nodule was Bx'd >> Thy CA >> sent for surgery (Dr Coralie Keens) >> 10/09/2013 >> PTC - 3 foci, metastatic in 1/1 LN.   ThyCa history: - 08/27/2013: Thyroid U/S [facility]):   Right lobe: 0.9 x 0.9 x 1 cm hypoechoic solid-appearing nodule with calcifications in upper pole. Solid-appearing nodule with calcifications measuring 2.4 x 1.5 x 2 cm mid pole. 0.7 x 0.5 x 0.4 cm complex appearing nodule in the lower pole  Left lobes: No nodules visualized  Isthmus: No nodules visualized  Lymphadenopathy: None visualized - 08/30/2013: Thyroid nodule biopsy [facility]): findings consistent with papillary carcinoma - 10/09/2013: Total thyroidectomy by Dr. Ninfa Linden - PAPILLARY THYROID CARCINOMA, MULTIPLE FOCI, THE LARGEST SPANS 2.5 CM, CONFINED TO THE THYROID. - LYMPHOVASCULAR INVASION IS IDENTIFIED. - ADENOCARCINOMA IS BROADLY LESS THAN 0.1 MM TO THE INKED MARGIN OF THE RIGHT MIDDLE LOBE. - PARATHYROID TISSUE (NEAR RIGHT LOWER LOBE). Lymph node, biopsy, Right lateral compartmental - METASTATIC PAPILLARY THYROID CARCINOMA IN 1 OF 1 LYMPH NODE (1/1). Tumor focality: Multiple foci (at least 4 grossly identified foci) Dominant tumor: Maximum tumor size (cm): 2.5 cm (gross measurement) Tumor laterality: Right lower lobe Histologic type: Papillary thyroid carcinoma Tumor capsule: Present Extrathyroidal extension: Not identified Margins: Negative for carcinoma Lymph - Vascular invasion: Present Capsular invasion with degree of invasion if present: Not identified Second largest tumor: Tumor  size(s): 1.0 cm (gross measurement) Tumor laterality: Right upper pole Histologic type : Papillary thyroid carcinoma Tumor capsule: Present Extrathyroidal extension: Not identified Margins: Negative for carcinoma Lymph - Vascular invasion: Present Capsular invasion with degree of invasion if present: Not identified  Lymph nodes: # examined 1; # positive; 1 TNM code: mpT2, pN1 Non-neoplastic thyroid: Lymphocytic thyroiditis  Comments: There are at least four grossly identified foci of papillary thyroid carcinoma, the largest spanning 2.5 cm. There is associated lymphovascular invasion. The 0.6 cm focus of carcinoma in the right middle lobe is broadly less than 0.1 cm from the inked resection margin. - 10/11/2013: Started Levothyroxine 100 mcg  - 01/16/2014: RAI tx 119 mCi I131 - 01/25/2014: post-Tx WBS:  1. Expected remnant activity in the neck. Potential small lymph node metastasis.  2. No evidence of distant metastasis.  We had problems allowing her the TSH to rise after stopping the Synthroid in preparation for the RAI tx (she was traveling) she was off the med for longer >> TSH increased to 52.  She takes Levothyroxine generic 100 mcg - fasting - with water - separated by >30 min from b'fast  - + calcium in pm - no iron - + B vitamins in pm - no PPI  Of note, she takes Biotin.  I reviewed pt's latest thyroid tests: Lab Results  Component Value Date   TSH 0.31* 08/28/2014   TSH 0.37 05/13/2014   TSH 0.46 02/26/2014   TSH 52.17* 01/14/2014   TSH 18.71* 12/21/2013   TSH 16.15* 12/14/2013   TSH 0.48 11/16/2013   TSH 2.390 01/15/2013   FREET4 1.31 10/04/2014   FREET4 1.28 05/13/2014  FREET4 0.17* 01/14/2014   FREET4 1.29 11/16/2013    She had dysphagia postop >> saw ENT >> now resolved; was told her vocal cords are inflamed >> advised to start Tx for GERD >> no result.   Pt c/o: - +++ fatigue - + water retention - + HAs (frontal, face) - + poor sleep - +  cold/heat intolerance - + palpitations - at night mostly  - no weight gain - no constipation - no dry skin - no hair falling - no depression  I reviewed pt's medications, allergies, PMH, social hx, family hx, and changes were documented in the history of present illness. Otherwise, unchanged from my initial visit note.  ROS: Constitutional: See HPI Eyes: no blurry vision, no xerophthalmia ENT: no sore throat, no nodules palpated in throat, no dysphagia/odynophagia, + hoarseness Cardiovascular: no CP/SOB/palpitations/leg swelling Respiratory: no cough/SOB Gastrointestinal: no N/V/D/C Musculoskeletal: no muscle aches/no joint aches Skin: no rashes Neurological: no tremors/numbness/tingling/dizziness, + HA   PE: BP 102/64 mmHg  Pulse 97  Temp(Src) 97.6 F (36.4 C) (Oral)  Resp 12  Wt 128 lb 3.2 oz (58.151 kg)  SpO2 98% Wt Readings from Last 3 Encounters:  11/08/14 128 lb 3.2 oz (58.151 kg)  08/30/14 130 lb 2 oz (59.024 kg)  06/24/14 125 lb 6.4 oz (56.881 kg)   Constitutional: normal weight, in NAD Eyes: PERRLA, EOMI, no exophthalmos ENT: moist mucous membranes, cervical scar healed, no dysesthesia, no cervical lymphadenopathy Cardiovascular: RRR, No MRG Respiratory: CTA B Gastrointestinal: abdomen soft, NT, ND, BS+ Musculoskeletal: no deformities, strength intact in all 4 Skin: moist, warm, no rashes Neurological: no tremor with outstretched hands, DTR normal in all 4  ASSESSMENT: 1. PTC (palpillary ThyCA) - stage 1, 2/2 age <20 y/o  2. Postsurgical Hypothyroidism  3. Fatigue  PLAN:  1. And 2. Patient with PTC, s/p thyroidectomy by Dr Ninfa Linden - She does not appear to have pain or discomfort at the surgical site - has some dry throat/pressure in throat/raspy voice but has a URI - we discussed follow-up - we will plan to repeat a Thyrogen-stimulated WBS in 1 year after the previous, which would be at the end of Aug- pt will call me in Aug. to order a WBS.  - will  check a Tg + ATA   2. Iatrogenic hypothyroidism - We discussed about correct intake of levothyroxine, fasting, with water, separated by at least 30 minutes from breakfast, and separated by more than 4 hours from calcium, iron, multivitamins, acid reflux medications (PPIs). She is taking it correctly.  - she c/o a lot of fatigue >> reviewed last TSH - a little low, which is desirable in 1st year after surgery >> will recheck again (off Biotin this time, - pt will return for this) and then change to brand name Synthroid - Continue 100 mcg Levothyroxine for now until new set of TFTs are back: TSH, free T4 (ordered) - discussed why desiccated thyroid extract is not ideal for ThyCa pts, but we can try this in the future  3. Fatigue - please see above - also reviewed labs recently obtained by PCP >> normal - will add a ferritin level Return in about 6 months (around 05/10/2015).   Needs Synthroid DAW when labs are back.

## 2014-11-11 ENCOUNTER — Other Ambulatory Visit (INDEPENDENT_AMBULATORY_CARE_PROVIDER_SITE_OTHER)

## 2014-11-11 DIAGNOSIS — R5382 Chronic fatigue, unspecified: Secondary | ICD-10-CM

## 2014-11-11 DIAGNOSIS — E890 Postprocedural hypothyroidism: Secondary | ICD-10-CM | POA: Diagnosis not present

## 2014-11-11 DIAGNOSIS — C73 Malignant neoplasm of thyroid gland: Secondary | ICD-10-CM

## 2014-11-11 LAB — FERRITIN: Ferritin: 86.7 ng/mL (ref 10.0–291.0)

## 2014-11-11 LAB — TSH: TSH: 0.93 u[IU]/mL (ref 0.35–4.50)

## 2014-11-11 LAB — T4, FREE: Free T4: 1.15 ng/dL (ref 0.60–1.60)

## 2014-11-12 ENCOUNTER — Ambulatory Visit: Admitting: Internal Medicine

## 2014-11-12 LAB — THYROGLOBULIN ANTIBODY: Thyroglobulin Ab: <1 [IU]/mL

## 2014-11-12 LAB — THYROGLOBULIN LEVEL: Thyroglobulin: 0.1 ng/mL — ABNORMAL LOW (ref 2.8–40.9)

## 2014-11-15 ENCOUNTER — Encounter: Payer: Self-pay | Admitting: Family Medicine

## 2014-11-15 ENCOUNTER — Ambulatory Visit (INDEPENDENT_AMBULATORY_CARE_PROVIDER_SITE_OTHER): Admitting: Family Medicine

## 2014-11-15 VITALS — BP 108/70 | HR 68 | Temp 97.3°F | Wt 129.8 lb

## 2014-11-15 DIAGNOSIS — J019 Acute sinusitis, unspecified: Secondary | ICD-10-CM | POA: Diagnosis not present

## 2014-11-15 DIAGNOSIS — R519 Headache, unspecified: Secondary | ICD-10-CM

## 2014-11-15 DIAGNOSIS — R51 Headache: Secondary | ICD-10-CM | POA: Diagnosis not present

## 2014-11-15 MED ORDER — FLUTICASONE PROPIONATE 50 MCG/ACT NA SUSP: 2.0000 | Freq: Every day | NASAL | Status: DC

## 2014-11-15 MED ORDER — AMOXICILLIN-POT CLAVULANATE 875-125 MG PO TABS: 1.0000 | ORAL_TABLET | Freq: Two times a day (BID) | ORAL | Status: AC

## 2014-11-15 MED ORDER — MONTELUKAST SODIUM 10 MG PO TABS: 10.0000 mg | ORAL_TABLET | Freq: Every day | ORAL | Status: DC

## 2014-11-15 NOTE — Progress Notes (Signed)
BP 108/70 mmHg  Pulse 68  Temp(Src) 97.3 F (36.3 C) (Oral)  Wt 129 lb 12.8 oz (58.877 kg)  SpO2 99%   CC: headache with sinus pressure  Subjective:    Patient ID: Maria Ray, female    DOB: 11-14-1970, 44 y.o.   MRN: [id number]  HPI: Maria Ray is a 43 y.o. female presenting on 11/15/2014 for Nasal Congestion; Cough; and Headache   9d h/o colored sputum production, headache with sinus pressure, dry cough. Last week with more URI sxs - that have improved. Persistent sneezing and coughing. Persistent rhinorrhea. Bad sinus headache last night treated with tylenol.   No fevers/chills.   + sick contacts.  Taking nasacort intermittently. Continued INS caused recurrent nosebleeds.   Getting sinus headaches every 3 days.   Discussed thyroid treatment to date.  Relevant past medical, surgical, family and social history reviewed and updated as indicated. Interim medical history since our last visit reviewed. Allergies and medications reviewed and updated. Current Outpatient Prescriptions on File Prior to Visit  Medication Sig  . Aspirin-Acetaminophen-Caffeine (EXCEDRIN PO) Take 1 tablet by mouth as needed.  . Biotin (BIOTIN MAXIMUM STRENGTH) 10 MG TABS Take 1 tablet by mouth daily.  . cholecalciferol (VITAMIN D) 1000 UNITS tablet Take 1,000 Units by mouth daily.  Marland Kitchen GARLIC 1696 PO Take 1 capsule by mouth daily.  . Ginkgo Biloba (GINKOBA PO) Take 1 capsule by mouth daily.  Marland Kitchen KRILL OIL PO Take by mouth.  . levothyroxine (SYNTHROID, LEVOTHROID) 100 MCG tablet Take 1 tablet (100 mcg total) by mouth daily.  . naproxen (NAPROSYN) 500 MG tablet Take one po bid x 5 days then prn pain, take with food   No current facility-administered medications on file prior to visit.    Review of Systems Per HPI unless specifically indicated above     Objective:    BP 108/70 mmHg  Pulse 68  Temp(Src) 97.3 F (36.3 C) (Oral)  Wt 129 lb 12.8 oz (58.877 kg)  SpO2 99%  Wt Readings from Last 3  Encounters:  11/15/14 129 lb 12.8 oz (58.877 kg)  11/08/14 128 lb 3.2 oz (58.151 kg)  08/30/14 130 lb 2 oz (59.024 kg)    Physical Exam  Constitutional: She appears well-developed and well-nourished. No distress.  HENT:  Head: Normocephalic and atraumatic.  Right Ear: Hearing, tympanic membrane, external ear and ear canal normal.  Left Ear: Hearing, tympanic membrane, external ear and ear canal normal.  Nose: Mucosal edema (pale boggy turbinates) and rhinorrhea present. Right sinus exhibits no maxillary sinus tenderness and no frontal sinus tenderness. Left sinus exhibits no maxillary sinus tenderness and no frontal sinus tenderness.  Mouth/Throat: Uvula is midline, oropharynx is clear and moist and mucous membranes are normal. No oropharyngeal exudate, posterior oropharyngeal edema, posterior oropharyngeal erythema or tonsillar abscesses.  Eyes: Conjunctivae and EOM are normal. Pupils are equal, round, and reactive to light. No scleral icterus.  Neck: Normal range of motion. Neck supple.  Cardiovascular: Normal rate, regular rhythm, normal heart sounds and intact distal pulses.   No murmur heard. Pulmonary/Chest: Effort normal and breath sounds normal. No respiratory distress. She has no wheezes. She has no rales.  Lymphadenopathy:    She has no cervical adenopathy.  Skin: Skin is warm and dry. No rash noted.  Nursing note and vitals reviewed.     Assessment & Plan:   Problem List Items Addressed This Visit    Acute sinusitis - Primary    Given duration and  progression of sxs, will treat with augmentin 10d course. Will also start flonase for nasal congestion. Update if not improving with treatment. See below for allergic rhinitis component.      Relevant Medications   amoxicillin-clavulanate (AUGMENTIN) 875-125 MG per tablet   fluticasone (FLONASE) 50 MCG/ACT nasal spray   Sinus headache    With evidence of allergic rhinitis on exam today - start flonase daily. If not  controlling sinus headaches, recommended trial of singulair. Printed Rx provided today.          Follow up plan: Return if symptoms worsen or fail to improve.

## 2014-11-15 NOTE — Assessment & Plan Note (Signed)
With evidence of allergic rhinitis on exam today - start flonase daily. If not controlling sinus headaches, recommended trial of singulair. Printed Rx provided today.

## 2014-11-15 NOTE — Progress Notes (Signed)
Pre visit review using our clinic review tool, if applicable. No additional management support is needed unless otherwise documented below in the visit note. 

## 2014-11-15 NOTE — Patient Instructions (Addendum)
I do think there's possible sinusitis infection going on - treat with augmentin antibiotic course. Start flonase nasal steroid but hold if any nosebleeds If flonase not effectively controlling sinus headaches, may try singulair for 1 month (prescription printed today)

## 2014-11-15 NOTE — Assessment & Plan Note (Signed)
Given duration and progression of sxs, will treat with augmentin 10d course. Will also start flonase for nasal congestion. Update if not improving with treatment. See below for allergic rhinitis component.

## 2014-11-27 ENCOUNTER — Encounter: Payer: Self-pay | Admitting: Family Medicine

## 2014-11-27 ENCOUNTER — Telehealth: Payer: Self-pay | Admitting: *Deleted

## 2014-11-27 ENCOUNTER — Encounter: Payer: Self-pay | Admitting: Internal Medicine

## 2014-11-27 MED ORDER — SYNTHROID 100 MCG PO TABS: 100.0000 ug | ORAL_TABLET | Freq: Every day | ORAL | Status: DC

## 2014-11-27 NOTE — Telephone Encounter (Signed)
Please see Mychart message from patient.  

## 2014-11-27 NOTE — Telephone Encounter (Signed)
Please read MyChart message.

## 2014-11-29 NOTE — Telephone Encounter (Signed)
Please see Mychart message from patient.  

## 2014-12-03 ENCOUNTER — Other Ambulatory Visit: Payer: Self-pay | Admitting: Internal Medicine

## 2014-12-03 DIAGNOSIS — E890 Postprocedural hypothyroidism: Secondary | ICD-10-CM

## 2014-12-03 NOTE — Telephone Encounter (Signed)
I d/w her through [place].

## 2014-12-10 ENCOUNTER — Ambulatory Visit: Admitting: Family Medicine

## 2014-12-17 ENCOUNTER — Encounter: Payer: Self-pay | Admitting: Internal Medicine

## 2014-12-22 DIAGNOSIS — Y998 Other external cause status: Secondary | ICD-10-CM | POA: Insufficient documentation

## 2014-12-22 DIAGNOSIS — Y9389 Activity, other specified: Secondary | ICD-10-CM | POA: Diagnosis not present

## 2014-12-22 DIAGNOSIS — S0181XA Laceration without foreign body of other part of head, initial encounter: Secondary | ICD-10-CM | POA: Insufficient documentation

## 2014-12-22 DIAGNOSIS — W01198A Fall on same level from slipping, tripping and stumbling with subsequent striking against other object, initial encounter: Secondary | ICD-10-CM | POA: Diagnosis not present

## 2014-12-22 DIAGNOSIS — S80212A Abrasion, left knee, initial encounter: Secondary | ICD-10-CM | POA: Insufficient documentation

## 2014-12-22 DIAGNOSIS — Y9289 Other specified places as the place of occurrence of the external cause: Secondary | ICD-10-CM | POA: Insufficient documentation

## 2014-12-22 DIAGNOSIS — S0990XA Unspecified injury of head, initial encounter: Secondary | ICD-10-CM | POA: Diagnosis present

## 2014-12-22 NOTE — ED Notes (Signed)
Reports tripped and fell striking left side of head.  Patient denies loss of consciousness.  Patient with laceration to left side of head and abrasion to left knee.

## 2014-12-23 ENCOUNTER — Emergency Department
Admission: EM | Admit: 2014-12-23 | Discharge: 2014-12-23 | Discharge status: Against Medical Advice | Attending: Emergency Medicine | Admitting: Emergency Medicine

## 2014-12-28 ENCOUNTER — Telehealth: Payer: Self-pay | Admitting: Family Medicine

## 2014-12-28 NOTE — Telephone Encounter (Signed)
Looks like pt presented to ER 7/25 with fall where she hit head but wait was too long.  plz call pt to ensure she's doing well and no residual sxs like headache, nausea/vomiting, or somnolence.

## 2014-12-30 NOTE — Telephone Encounter (Signed)
Spoke to patient and was advised that she went to [facility] and sit for 2 hours and left.  Patient stated that she has still has a headache and a bump on her head and thinks that she may have had a concussion. Patient stated that she was really upset when she left [facility]. Patient stated that she is bringing her daughter in for an appointment tomorrow afternoon and will discuss this with Dr. Darnell Level at that time. Patient stated that she thinks that she is getting better each day.

## 2015-01-07 ENCOUNTER — Other Ambulatory Visit: Payer: Self-pay | Admitting: *Deleted

## 2015-01-07 MED ORDER — SYNTHROID 100 MCG PO TABS: 100.0000 ug | ORAL_TABLET | Freq: Every day | ORAL | Status: DC

## 2015-01-08 ENCOUNTER — Other Ambulatory Visit: Payer: Self-pay | Admitting: *Deleted

## 2015-01-08 MED ORDER — MONTELUKAST SODIUM 10 MG PO TABS: 10.0000 mg | ORAL_TABLET | Freq: Every day | ORAL | Status: DC

## 2015-01-15 ENCOUNTER — Encounter: Payer: Self-pay | Admitting: Internal Medicine

## 2015-01-15 ENCOUNTER — Encounter: Payer: Self-pay | Admitting: *Deleted

## 2015-01-17 ENCOUNTER — Other Ambulatory Visit: Payer: Self-pay | Admitting: Internal Medicine

## 2015-01-17 DIAGNOSIS — C73 Malignant neoplasm of thyroid gland: Secondary | ICD-10-CM

## 2015-01-24 ENCOUNTER — Encounter: Payer: Self-pay | Admitting: Internal Medicine

## 2015-01-27 ENCOUNTER — Encounter: Payer: Self-pay | Admitting: Internal Medicine

## 2015-01-28 ENCOUNTER — Encounter: Payer: Self-pay | Admitting: *Deleted

## 2015-01-30 ENCOUNTER — Encounter: Payer: Self-pay | Admitting: Family Medicine

## 2015-01-30 MED ORDER — MONTELUKAST SODIUM 10 MG PO TABS: 10.0000 mg | ORAL_TABLET | Freq: Every day | ORAL | Status: DC

## 2015-02-04 ENCOUNTER — Ambulatory Visit: Payer: Self-pay | Admitting: Family Medicine

## 2015-02-05 ENCOUNTER — Encounter: Payer: Self-pay | Admitting: *Deleted

## 2015-02-06 MED ORDER — MONTELUKAST SODIUM 10 MG PO TABS: 10.0000 mg | ORAL_TABLET | Freq: Every day | ORAL | Status: DC

## 2015-02-17 ENCOUNTER — Encounter: Payer: Self-pay | Admitting: Internal Medicine

## 2015-02-17 ENCOUNTER — Encounter (HOSPITAL_COMMUNITY)
Admission: RE | Admit: 2015-02-17 | Discharge: 2015-02-17 | Disposition: A | Discharge status: Home / Self Care | Source: Ambulatory Visit | Attending: Internal Medicine | Admitting: Internal Medicine

## 2015-02-17 DIAGNOSIS — C73 Malignant neoplasm of thyroid gland: Secondary | ICD-10-CM | POA: Insufficient documentation

## 2015-02-17 MED ORDER — THYROTROPIN ALFA 1.1 MG IM SOLR
0.9000 mg | INTRAMUSCULAR | Status: AC
  Administered 2015-02-17: 0.9 mg via INTRAMUSCULAR

## 2015-02-17 MED ORDER — THYROTROPIN ALFA 1.1 MG IM SOLR
INTRAMUSCULAR | Status: AC
  Filled 2015-02-17: qty 0.9

## 2015-02-18 ENCOUNTER — Encounter (HOSPITAL_COMMUNITY)
Admission: RE | Admit: 2015-02-18 | Discharge: 2015-02-18 | Disposition: A | Discharge status: Home / Self Care | Source: Ambulatory Visit | Attending: Internal Medicine | Admitting: Internal Medicine

## 2015-02-18 DIAGNOSIS — C73 Malignant neoplasm of thyroid gland: Secondary | ICD-10-CM | POA: Diagnosis not present

## 2015-02-18 MED ORDER — THYROTROPIN ALFA 1.1 MG IM SOLR
INTRAMUSCULAR | Status: AC
  Filled 2015-02-18: qty 0.9

## 2015-02-18 MED ORDER — THYROTROPIN ALFA 1.1 MG IM SOLR
0.9000 mg | INTRAMUSCULAR | Status: AC
  Administered 2015-02-18: 0.9 mg via INTRAMUSCULAR

## 2015-02-19 ENCOUNTER — Encounter (HOSPITAL_COMMUNITY)
Admission: RE | Admit: 2015-02-19 | Discharge: 2015-02-19 | Disposition: A | Discharge status: Home / Self Care | Source: Ambulatory Visit | Attending: Internal Medicine | Admitting: Internal Medicine

## 2015-02-21 ENCOUNTER — Encounter (HOSPITAL_COMMUNITY)
Admission: RE | Admit: 2015-02-21 | Discharge: 2015-02-21 | Disposition: A | Discharge status: Home / Self Care | Source: Ambulatory Visit | Attending: Internal Medicine | Admitting: Internal Medicine

## 2015-02-21 DIAGNOSIS — C73 Malignant neoplasm of thyroid gland: Secondary | ICD-10-CM | POA: Diagnosis not present

## 2015-02-21 MED ORDER — SODIUM IODIDE I 131 CAPSULE
4.0000 | Freq: Once | INTRAVENOUS | Status: AC | PRN
  Administered 2015-02-21: 4 via ORAL

## 2015-02-24 ENCOUNTER — Encounter: Payer: Self-pay | Admitting: Family Medicine

## 2015-03-03 ENCOUNTER — Encounter: Payer: Self-pay | Admitting: Family Medicine

## 2015-03-03 DIAGNOSIS — C73 Malignant neoplasm of thyroid gland: Secondary | ICD-10-CM

## 2015-03-03 DIAGNOSIS — E890 Postprocedural hypothyroidism: Secondary | ICD-10-CM

## 2015-03-03 NOTE — Telephone Encounter (Signed)
Referral placed.

## 2015-03-19 ENCOUNTER — Ambulatory Visit (INDEPENDENT_AMBULATORY_CARE_PROVIDER_SITE_OTHER)

## 2015-03-19 DIAGNOSIS — Z23 Encounter for immunization: Secondary | ICD-10-CM

## 2015-06-27 ENCOUNTER — Telehealth: Payer: Self-pay | Admitting: Family Medicine

## 2015-06-27 NOTE — Telephone Encounter (Signed)
Pt made my chart appointment Is it ok to wait till 1/30  See below    Appointment For: [facility] ([id number])   Visit Type: MYCHART OFFICE VISIT (1064)      06/30/2015  9:15 AM 15 mins. Ria Bush, MD   [facility]      Patient Comments:   Office Visit   I'm having heart palpitations and some heaviness in my    chest. Occasionally I feel a little light headed. Due    to my recent bout with thyroid cancer and feeling    somewhat similar symptoms preceding my diagnosis I    want to get checked.

## 2015-06-27 NOTE — Telephone Encounter (Signed)
Ok to wait. Thanks.

## 2015-06-30 ENCOUNTER — Encounter: Payer: Self-pay | Admitting: Family Medicine

## 2015-06-30 ENCOUNTER — Ambulatory Visit (INDEPENDENT_AMBULATORY_CARE_PROVIDER_SITE_OTHER): Admitting: Family Medicine

## 2015-06-30 VITALS — BP 118/82 | HR 64 | Temp 98.1°F | Wt 129.4 lb

## 2015-06-30 DIAGNOSIS — R002 Palpitations: Secondary | ICD-10-CM

## 2015-06-30 LAB — BASIC METABOLIC PANEL
BUN: 6 mg/dL (ref 6–23)
CALCIUM: 9.2 mg/dL (ref 8.4–10.5)
CHLORIDE: 105 meq/L (ref 96–112)
CO2: 27 meq/L (ref 19–32)
Creatinine, Ser: 0.81 mg/dL (ref 0.40–1.20)
GFR: 81.12 mL/min (ref >60.00)
Glucose, Bld: 86 mg/dL (ref 70–99)
Potassium: 4.5 mEq/L (ref 3.5–5.1)
Sodium: 139 mEq/L (ref 135–145)

## 2015-06-30 LAB — CBC WITH DIFFERENTIAL/PLATELET
BASOS ABS: 0 10*3/uL (ref 0.0–0.1)
Basophils Relative: 0.5 % (ref 0.0–3.0)
Eosinophils Absolute: 0.1 10*3/uL (ref 0.0–0.7)
Eosinophils Relative: 1 % (ref 0.0–5.0)
HCT: 40.3 % (ref 36.0–46.0)
Hemoglobin: 13.3 g/dL (ref 12.0–15.0)
LYMPHS ABS: 1.3 10*3/uL (ref 0.7–4.0)
Lymphocytes Relative: 23.2 % (ref 12.0–46.0)
MCHC: 33 g/dL (ref 30.0–36.0)
MCV: 92.2 fl (ref 78.0–100.0)
MONO ABS: 0.6 10*3/uL (ref 0.1–1.0)
Monocytes Relative: 9.8 % (ref 3.0–12.0)
NEUTROS PCT: 65.5 % (ref 43.0–77.0)
Neutro Abs: 3.8 10*3/uL (ref 1.4–7.7)
Platelets: 238 10*3/uL (ref 150.0–400.0)
RBC: 4.37 Mil/uL (ref 3.87–5.11)
RDW: 13.1 % (ref 11.5–15.5)
WBC: 5.8 10*3/uL (ref 4.0–10.5)

## 2015-06-30 LAB — T4, FREE: FREE T4: 1.5 ng/dL (ref 0.60–1.60)

## 2015-06-30 LAB — TSH: TSH: 0.07 u[IU]/mL — AB (ref 0.35–4.50)

## 2015-06-30 NOTE — Patient Instructions (Signed)
EKG normal today Blood work today.  If persistent episodes, let me know for referral to cardiology to discuss possible monitor.

## 2015-06-30 NOTE — Progress Notes (Signed)
BP 118/82 mmHg  Pulse 64  Temp(Src) 98.1 F (36.7 C) (Oral)  Wt 129 lb 6 oz (58.684 kg)   CC: palpitations  Subjective:    Patient ID: Maria Ray, female    DOB: 10-Jan-1971, 45 y.o.   MRN: [id number]  HPI: Maria Ray is a 45 y.o. female presenting on 06/30/2015 for Palpitations   Ongoing palpitations over last 1.5 weeks described as possible skipped beat and louder heart beat than normal. This tends to happen when exercising or when in bed. Last episode Tuesday night associated with lightheadedness (not vertigo or presyncope). Some dyspnea that night only.  No nausea, chest pain/tightness, weight changes, night sweats, fevers/chills.  Sleeping is normal. No significant caffeine intake.   ST x 24 hrs. Did have bad sinus headache 1 wk ago.   Exercising twice weekly - roller skating.   Relevant past medical, surgical, family and social history reviewed and updated as indicated. Interim medical history since our last visit reviewed. Allergies and medications reviewed and updated. Current Outpatient Prescriptions on File Prior to Visit  Medication Sig  . Aspirin-Acetaminophen-Caffeine (EXCEDRIN PO) Take 1 tablet by mouth as needed.  . Biotin (BIOTIN MAXIMUM STRENGTH) 10 MG TABS Take 1 tablet by mouth daily.  . cholecalciferol (VITAMIN D) 1000 UNITS tablet Take 1,000 Units by mouth daily.  Marland Kitchen GARLIC 99991111 PO Take 1 capsule by mouth daily.  . Ginkgo Biloba (GINKOBA PO) Take 1 capsule by mouth daily.  Marland Kitchen KRILL OIL PO Take by mouth.  . montelukast (SINGULAIR) 10 MG tablet Take 1 tablet (10 mg total) by mouth at bedtime.  Marland Kitchen SYNTHROID 100 MCG tablet Take 1 tablet (100 mcg total) by mouth daily before breakfast.  . vitamin B-12 (CYANOCOBALAMIN) 1000 MCG tablet Take 1,000 mcg by mouth daily.  . vitamin E 400 UNIT capsule Take 400 Units by mouth daily.  . naproxen (NAPROSYN) 500 MG tablet Take one po bid x 5 days then prn pain, take with food (Patient not taking: Reported on 06/30/2015)    No current facility-administered medications on file prior to visit.    Review of Systems Per HPI unless specifically indicated in ROS section     Objective:    BP 118/82 mmHg  Pulse 64  Temp(Src) 98.1 F (36.7 C) (Oral)  Wt 129 lb 6 oz (58.684 kg)  Wt Readings from Last 3 Encounters:  06/30/15 129 lb 6 oz (58.684 kg)  12/22/14 130 lb (58.968 kg)  11/15/14 129 lb 12.8 oz (58.877 kg)    Physical Exam  Constitutional: She appears well-developed and well-nourished. No distress.  HENT:  Mouth/Throat: Oropharynx is clear and moist. No oropharyngeal exudate.  Neck: Normal range of motion. Neck supple. No thyromegaly present.  Cardiovascular: Normal rate, regular rhythm, normal heart sounds and intact distal pulses.   No murmur heard. Pulmonary/Chest: Effort normal and breath sounds normal. No respiratory distress. She has no wheezes. She has no rales.  Lymphadenopathy:    She has no cervical adenopathy.  Skin: Skin is warm and dry. No rash noted.  Psychiatric: She has a normal mood and affect.  Nursing note and vitals reviewed.  Results for orders placed or performed in visit on 11/11/14  TSH  Result Value Ref Range   TSH 0.93 0.35 - 4.50 uIU/mL  T4, free  Result Value Ref Range   Free T4 1.15 0.60 - 1.60 ng/dL  Ferritin  Result Value Ref Range   Ferritin 86.7 10.0 - 291.0 ng/mL  Thyroglobulin antibody  Result Value Ref Range   Thyroglobulin Ab <1 <2 IU/mL  Thyroglobulin Level  Result Value Ref Range   Thyroglobulin 0.1 (L) 2.8 - 40.9 ng/mL      Assessment & Plan:   Problem List Items Addressed This Visit    Palpitations - Primary    Anticipate benign ectopic beats, does not describe tachyarrhythmias. EKG normal today. Check labs today (TSH, CBC, BMP) If persistent or worsening, consider cards referral for possible monitor. Pt agrees with plan.  EKG - NSR rate 60, normal axis, intervals, no acute ST/T changes      Relevant Orders   EKG 12-Lead (Completed)    TSH   T4, free   CBC with Differential/Platelet   Basic metabolic panel       Follow up plan: No Follow-up on file.

## 2015-06-30 NOTE — Progress Notes (Signed)
Pre visit review using our clinic review tool, if applicable. No additional management support is needed unless otherwise documented below in the visit note. 

## 2015-06-30 NOTE — Assessment & Plan Note (Signed)
Anticipate benign ectopic beats, does not describe tachyarrhythmias. EKG normal today. Check labs today (TSH, CBC, BMP) If persistent or worsening, consider cards referral for possible monitor. Pt agrees with plan.  EKG - NSR rate 60, normal axis, intervals, no acute ST/T changes

## 2015-07-06 ENCOUNTER — Encounter: Payer: Self-pay | Admitting: Family Medicine

## 2015-07-06 ENCOUNTER — Other Ambulatory Visit: Payer: Self-pay | Admitting: Family Medicine

## 2015-07-06 MED ORDER — SYNTHROID 88 MCG PO TABS: 88.0000 ug | ORAL_TABLET | Freq: Every day | ORAL | Status: DC

## 2015-07-06 NOTE — Telephone Encounter (Signed)
Plz forward latest office note and labs to Dr Gabriel Carina. I believe pt will call to make appointment to review need in change in doe of Synthroid.

## 2015-07-07 ENCOUNTER — Telehealth: Payer: Self-pay | Admitting: Family Medicine

## 2015-07-07 NOTE — Telephone Encounter (Signed)
Opened in error

## 2015-07-08 NOTE — Telephone Encounter (Signed)
Notes and labs faxed to Dr. Gabriel Carina.

## 2015-07-10 NOTE — Addendum Note (Signed)
Addended by: Ria Bush on: 07/10/2015 09:31 AM   Modules accepted: Orders

## 2015-10-07 ENCOUNTER — Telehealth: Payer: Self-pay | Admitting: Family Medicine

## 2015-10-07 ENCOUNTER — Emergency Department (HOSPITAL_COMMUNITY)

## 2015-10-07 ENCOUNTER — Emergency Department (HOSPITAL_COMMUNITY)
Admission: EM | Admit: 2015-10-07 | Discharge: 2015-10-07 | Disposition: A | Discharge status: Home / Self Care | Attending: Emergency Medicine | Admitting: Emergency Medicine

## 2015-10-07 ENCOUNTER — Encounter (HOSPITAL_COMMUNITY): Payer: Self-pay

## 2015-10-07 DIAGNOSIS — E890 Postprocedural hypothyroidism: Secondary | ICD-10-CM | POA: Insufficient documentation

## 2015-10-07 DIAGNOSIS — G43909 Migraine, unspecified, not intractable, without status migrainosus: Secondary | ICD-10-CM | POA: Insufficient documentation

## 2015-10-07 DIAGNOSIS — Z8709 Personal history of other diseases of the respiratory system: Secondary | ICD-10-CM | POA: Diagnosis not present

## 2015-10-07 DIAGNOSIS — M542 Cervicalgia: Secondary | ICD-10-CM | POA: Diagnosis not present

## 2015-10-07 DIAGNOSIS — R001 Bradycardia, unspecified: Secondary | ICD-10-CM

## 2015-10-07 DIAGNOSIS — C73 Malignant neoplasm of thyroid gland: Secondary | ICD-10-CM

## 2015-10-07 DIAGNOSIS — R002 Palpitations: Secondary | ICD-10-CM | POA: Insufficient documentation

## 2015-10-07 DIAGNOSIS — I159 Secondary hypertension, unspecified: Secondary | ICD-10-CM | POA: Diagnosis not present

## 2015-10-07 DIAGNOSIS — Z85850 Personal history of malignant neoplasm of thyroid: Secondary | ICD-10-CM | POA: Diagnosis not present

## 2015-10-07 DIAGNOSIS — Z79899 Other long term (current) drug therapy: Secondary | ICD-10-CM | POA: Diagnosis not present

## 2015-10-07 DIAGNOSIS — R079 Chest pain, unspecified: Secondary | ICD-10-CM | POA: Diagnosis present

## 2015-10-07 LAB — BASIC METABOLIC PANEL
ANION GAP: 11 (ref 5–15)
BUN: 11 mg/dL (ref 6–20)
CALCIUM: 9.8 mg/dL (ref 8.9–10.3)
CHLORIDE: 105 mmol/L (ref 101–111)
CO2: 25 mmol/L (ref 22–32)
Creatinine, Ser: 0.82 mg/dL (ref 0.44–1.00)
GFR calc non Af Amer: >60 mL/min (ref >60)
Glucose, Bld: 86 mg/dL (ref 65–99)
Potassium: 3.7 mmol/L (ref 3.5–5.1)
SODIUM: 141 mmol/L (ref 135–145)

## 2015-10-07 LAB — CBC
HCT: 43.5 % (ref 36.0–46.0)
HEMOGLOBIN: 14.1 g/dL (ref 12.0–15.0)
MCH: 29.4 pg (ref 26.0–34.0)
MCHC: 32.4 g/dL (ref 30.0–36.0)
MCV: 90.6 fL (ref 78.0–100.0)
Platelets: 270 10*3/uL (ref 150–400)
RBC: 4.8 MIL/uL (ref 3.87–5.11)
RDW: 12.3 % (ref 11.5–15.5)
WBC: 7.5 10*3/uL (ref 4.0–10.5)

## 2015-10-07 LAB — I-STAT TROPONIN, ED: TROPONIN I, POC: 0 ng/mL (ref 0.00–0.08)

## 2015-10-07 LAB — T4, FREE: FREE T4: 1.24 ng/dL — AB (ref 0.61–1.12)

## 2015-10-07 LAB — D-DIMER, QUANTITATIVE (NOT AT ARMC)

## 2015-10-07 LAB — TSH: TSH: 0.496 u[IU]/mL (ref 0.350–4.500)

## 2015-10-07 IMAGING — DX DG CHEST 2V
2 series · 2 of 2 positions shown · non-contrast
Comparison: 10/01/2013.

CLINICAL DATA: Right chest pain, right shoulder pain, right neck
pain, pain behind the right ear and shortness of breath.

EXAM:
CHEST  2 VIEW

[chest pa]
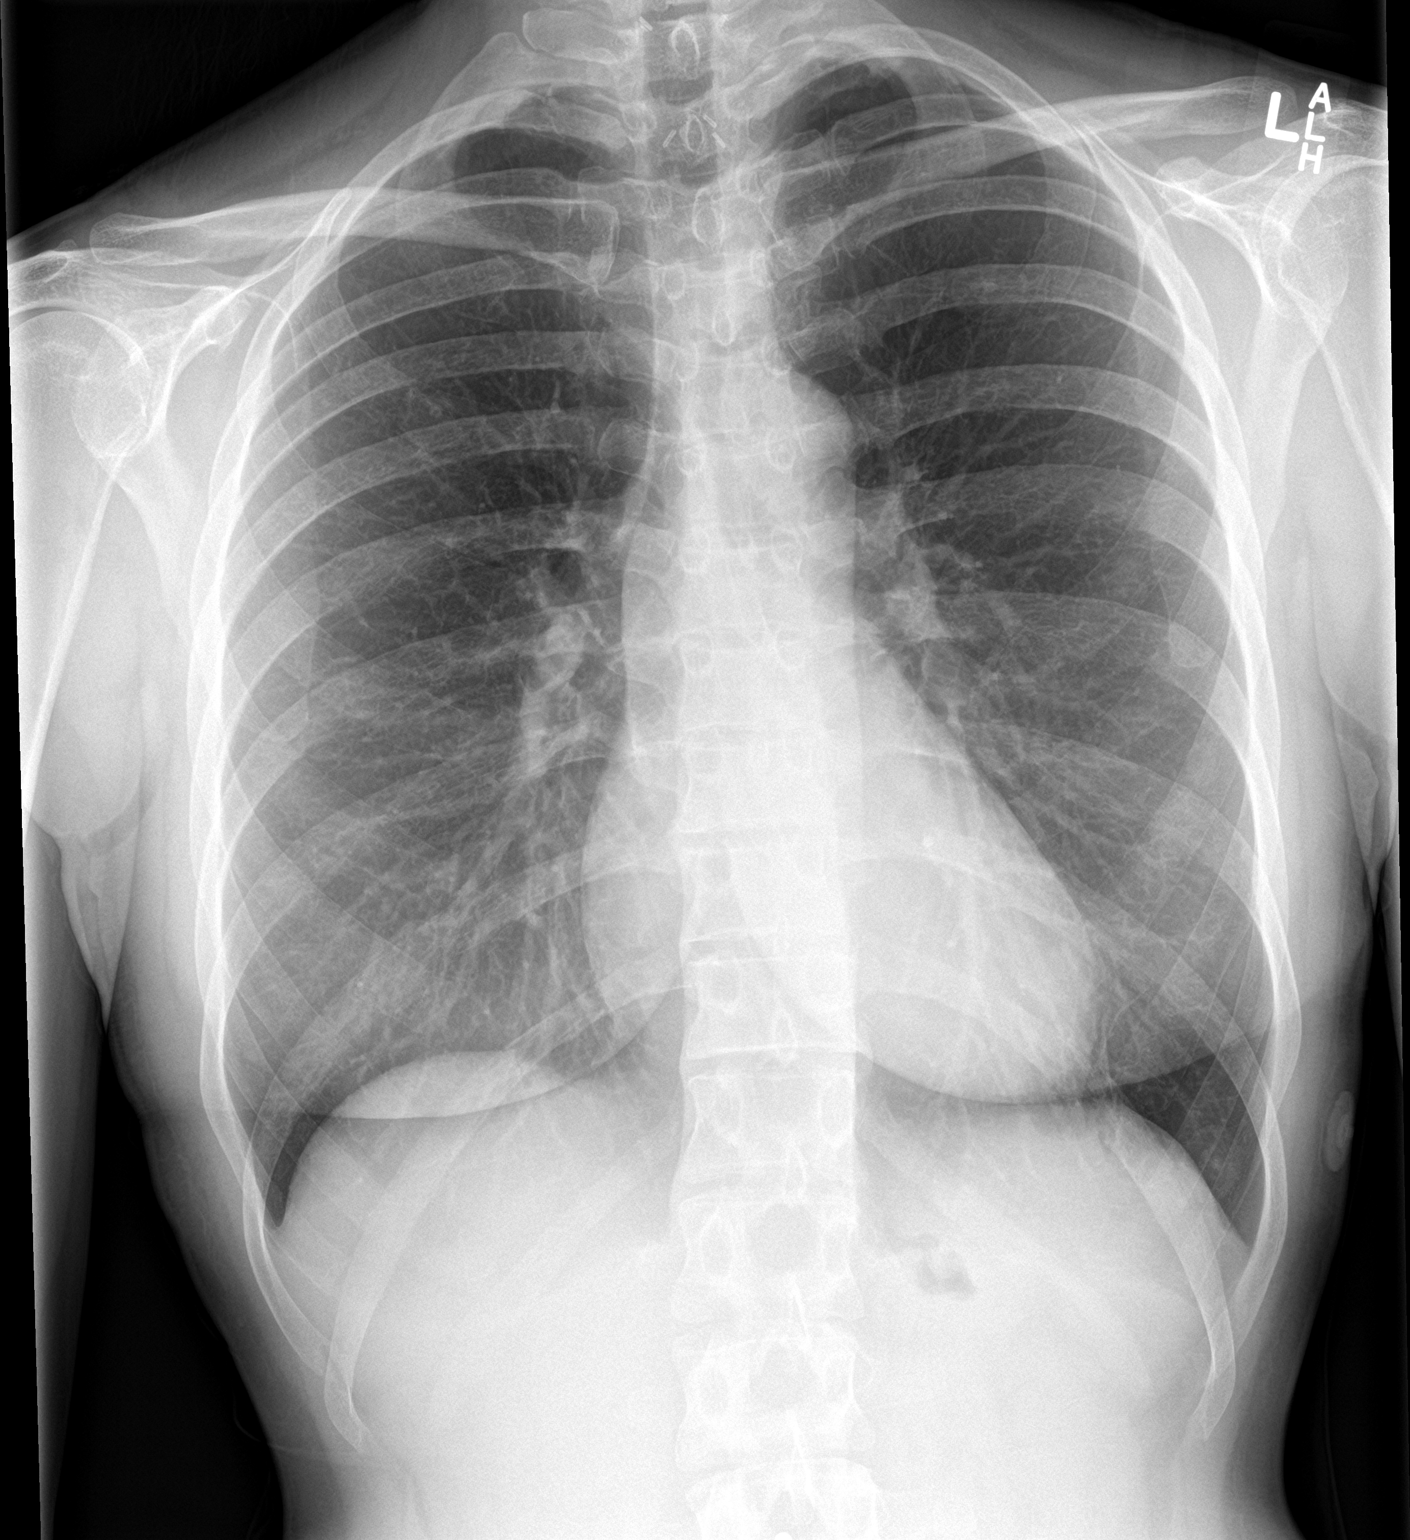

[chest lat]
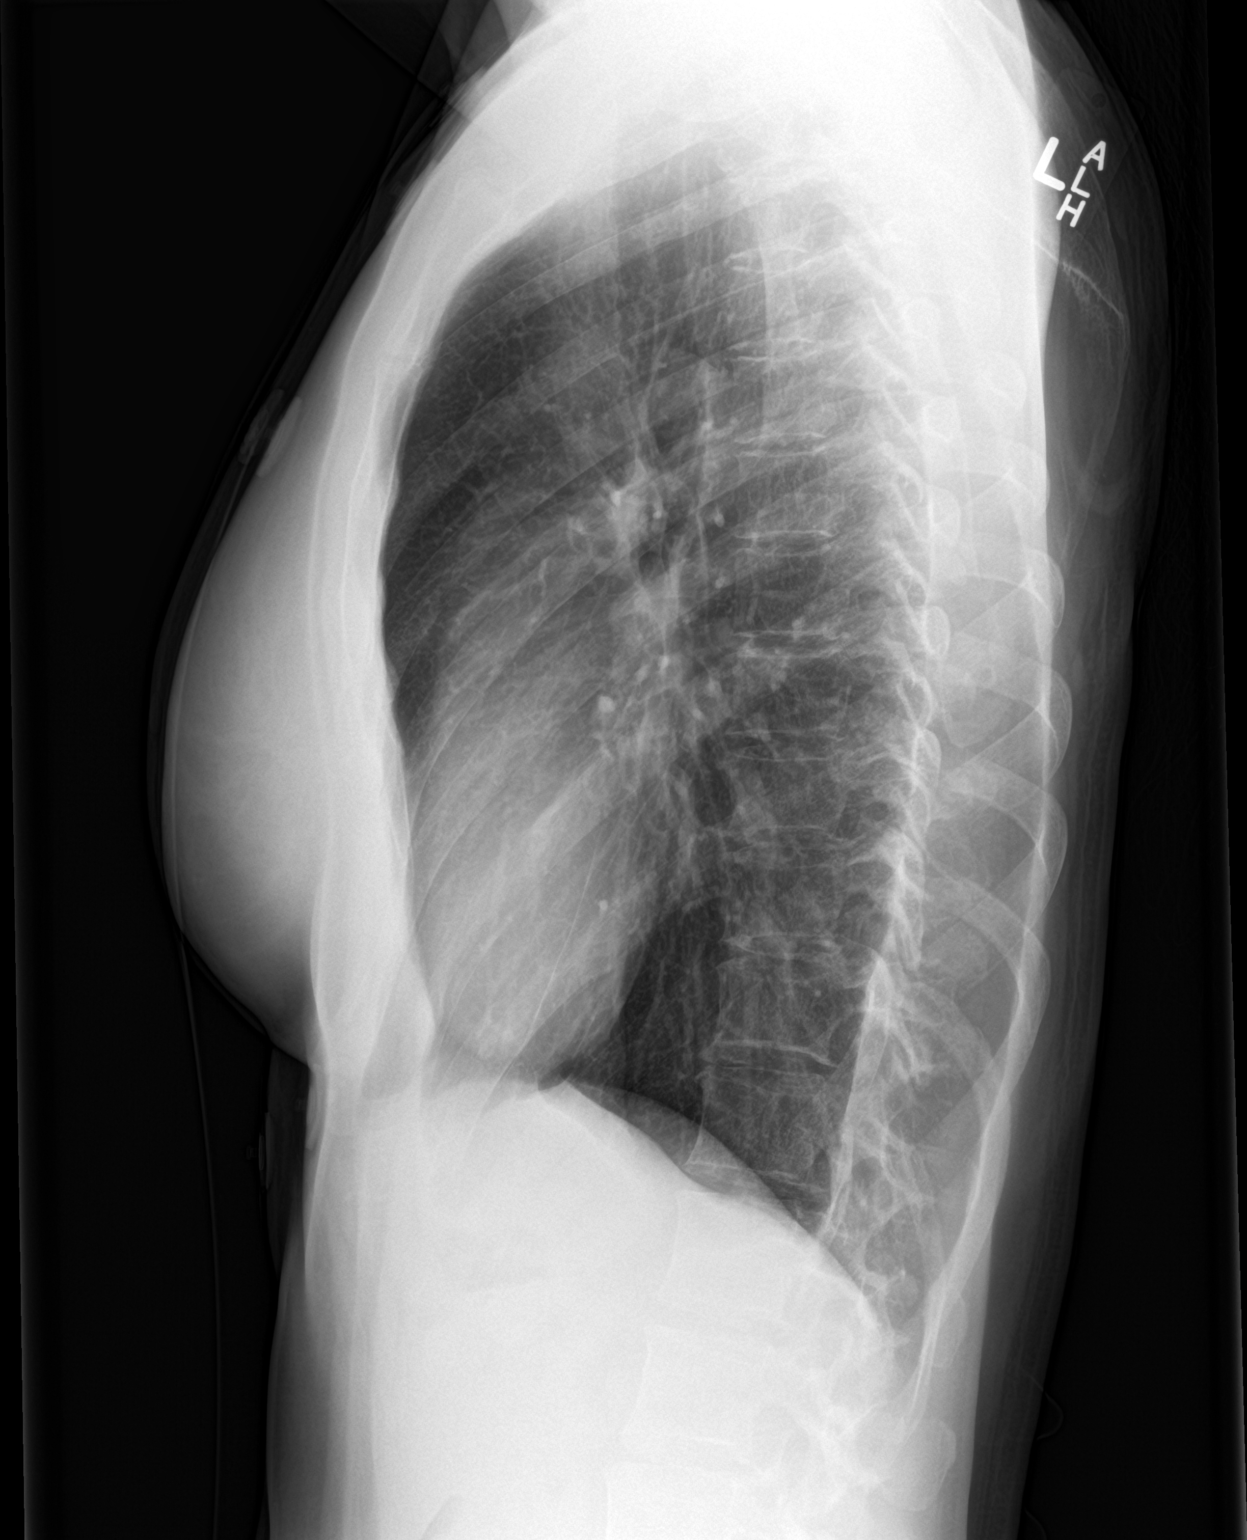

[2 of 2 positions shown; findings below may reference images not displayed]

FINDINGS: Normal sized heart. Clear lungs. Minimal lower thoracic spine
degenerative changes.
IMPRESSION: No acute abnormality.

## 2015-10-07 NOTE — Telephone Encounter (Signed)
[facility]  Patient Name: Maria Ray  DOB: Dec 31, 1970    Initial Comment Caller states yesterday afternoon had a piercing pain behind ear and head, now it has radiating down to her arm and chest feels heavy.    Nurse Assessment  Nurse: Harlow Mares, RN, Suanne Marker Date/Time (Eastern Time): 10/07/2015 3:48:25 PM  Confirm and document reason for call. If symptomatic, describe symptoms. You must click the next button to save text entered. ---Caller states yesterday afternoon had a piercing pain behind ear and head, now it has radiating down to her arm and chest feels heavy. Reports that the R arm and shoulder is all of a sudden feels funny. Doesn't feel "right". No SOB. In the car with her husband. Reports recent thyroid cancer. Nervous that something is happening like a stroke. Also reports a tick bite yesterday, removed, no red mark. Removed herself.  Has the patient traveled out of the country within the last 30 days? ---No  Does the patient have any new or worsening symptoms? ---Yes  Will a triage be completed? ---Yes  Related visit to physician within the last 2 weeks? ---No  Does the PT have any chronic conditions? (i.e. diabetes, asthma, etc.) ---Yes  List chronic conditions. ---thyroid cancer hx;  Is the patient pregnant or possibly pregnant? (Ask all females between the ages of 42-55) ---No  Is this a behavioral health or substance abuse call? ---No     Guidelines    Guideline Title Affirmed Question Affirmed Notes  Chest Pain Pain also present in shoulder(s) or arm(s) or jaw (Exception: pain is clearly made worse by movement)    Final Disposition User   Go to ED Now Harlow Mares, RN, [facility] - ED   Disagree/Comply: Comply

## 2015-10-07 NOTE — ED Notes (Signed)
Pt here with c/o pressure, right side chest pain onset today around 1230. She states she feels weird and not like herself today and is having palpitations. She also reports headache.

## 2015-10-07 NOTE — Discharge Instructions (Signed)
Have your blood pressure rechecked by your primary care doctor. We also recommend a follow-up with cardiology. You may benefit from having a Holter monitor placed to further monitor your symptoms and heart rate. Return to the emergency department as needed if symptoms worsen.  Palpitations A palpitation is the feeling that your heartbeat is irregular or is faster than normal. It may feel like your heart is fluttering or skipping a beat. Palpitations are usually not a serious problem. However, in some cases, you may need further medical evaluation. CAUSES  Palpitations can be caused by:  Smoking.  Caffeine or other stimulants, such as diet pills or energy drinks.  Alcohol.  Stress and anxiety.  Strenuous physical activity.  Fatigue.  Certain medicines.  Heart disease, especially if you have a history of irregular heart rhythms (arrhythmias), such as atrial fibrillation, atrial flutter, or supraventricular tachycardia.  An improperly working pacemaker or defibrillator. DIAGNOSIS  To find the cause of your palpitations, your health care provider will take your medical history and perform a physical exam. Your health care provider may also have you take a test called an ambulatory electrocardiogram (ECG). An ECG records your heartbeat patterns over a 24-hour period. You may also have other tests, such as:  Transthoracic echocardiogram (TTE). During echocardiography, sound waves are used to evaluate how blood flows through your heart.  Transesophageal echocardiogram (TEE).  Cardiac monitoring. This allows your health care provider to monitor your heart rate and rhythm in real time.  Holter monitor. This is a portable device that records your heartbeat and can help diagnose heart arrhythmias. It allows your health care provider to track your heart activity for several days, if needed.  Stress tests by exercise or by giving medicine that makes the heart beat faster. TREATMENT  Treatment  of palpitations depends on the cause of your symptoms and can vary greatly. Most cases of palpitations do not require any treatment other than time, relaxation, and monitoring your symptoms. Other causes, such as atrial fibrillation, atrial flutter, or supraventricular tachycardia, usually require further treatment. HOME CARE INSTRUCTIONS   Avoid:  Caffeinated coffee, tea, soft drinks, diet pills, and energy drinks.  Chocolate.  Alcohol.  Stop smoking if you smoke.  Reduce your stress and anxiety. Things that can help you relax include:  A method of controlling things in your body, such as your heartbeats, with your mind (biofeedback).  Yoga.  Meditation.  Physical activity such as swimming, jogging, or walking.  Get plenty of rest and sleep. SEEK MEDICAL CARE IF:   You continue to have a fast or irregular heartbeat beyond 24 hours.  Your palpitations occur more often. SEEK IMMEDIATE MEDICAL CARE IF:  You have chest pain or shortness of breath.  You have a severe headache.  You feel dizzy or you faint. MAKE SURE YOU:  Understand these instructions.  Will watch your condition.  Will get help right away if you are not doing well or get worse.   This information is not intended to replace advice given to you by your health care provider. Make sure you discuss any questions you have with your health care provider.   Document Released: 05/14/2000 Document Revised: 05/22/2013 Document Reviewed: 07/16/2011 Elsevier Interactive Patient Education [employer].

## 2015-10-07 NOTE — ED Provider Notes (Signed)
CSN: [id number]     Arrival date & time 10/07/15  1703 History   First MD Initiated Contact with Patient 10/07/15 2011     Chief Complaint  Patient presents with  . 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 Patient states that she awoke this morning and noticed pain to the right side of her neck. She states that this improved after taking some Aleve. Symptoms progressed to include right-sided chest pain described as a pressure at 12:30 PM. She states that she also noticed some palpitations. The patient checked her heart rate at this time and found it to be low; in the 50s. She states that she has had palpitations intermittently and that her pressure has been fairly constant since onset. Patient continues to state that she "doesn't feel right". She endorses a feeling of fatigue. She states that she had a headache characterized as a sharp pain behind her right ear yesterday. This has since resolved. She complains of some mild nausea. She has had no fever, syncope, diaphoresis, vomiting, diarrhea, abdominal pain, or extremity weakness. Patient is currently on Synthroid. She states that they have been keeping her TSH levels low given her cancer history. They increased her dose in March and she has yet to be back to her endocrinologist for follow-up. No personal hx of ACS, HTN, HLD, DM, or DVT/PE. No FHx of ACS; father did have heart disease and PCI.  Patient is a 45 y.o. female presenting with chest pain. The history is provided by the patient. No language interpreter was used.  Chest Pain Associated symptoms: fatigue, nausea and shortness of breath (mild)   Associated symptoms: no abdominal pain, no diaphoresis, no fever, no numbness, not vomiting and no weakness      Past Medical History  Diagnosis Date  . Allergic rhinitis   . Papillary thyroid carcinoma ([place]) 09/03/2013    s/p thyroidectomy then radioiodine ablation per endo ([facility])  . Migraine     "maybe a couple times/yr" (10/09/2013)  . Cluster headache   . Hoarseness of voice 01/2014    after thyroidectomy s/p ENT eval ?GERD - did not improve w/ PPI trial  . Post-surgical hypothyroidism 2015    for papillary thyroid cancer   Past Surgical History  Procedure Laterality Date  . Mri  2010    brain WNL per patient  . Tympanostomy  child  . Thyroidectomy, partial  10/09/2013  . Tonsillectomy and adenoidectomy  1976  . Carpal tunnel with cubital tunnel Bilateral 2004-2006  . Abdominal hysterectomy  2010    partial; heavy bleeding, ovaries remain  . Reduction mammaplasty  1990  . Thyroidectomy N/A 10/09/2013    Procedure: THYROIDECTOMY SUBTOTAL;  Surgeon: Harl Bowie, MD   Family History  Problem Relation Age of Onset  . Hypertension Father   . Cancer Paternal Grandfather 60    colon  . Cancer Maternal Grandfather     bone?  . Stroke Neg Hx   . Diabetes Neg Hx   . CAD Father 30    stent   Social History  Substance Use Topics  . Smoking status: Never Smoker   . Smokeless tobacco: Never Used  . Alcohol Use: No   OB History    No data available      Review of Systems  Constitutional: Positive for fatigue. Negative for  fever and diaphoresis.  Respiratory: Positive for shortness of breath (mild).   Cardiovascular: Positive for chest pain.  Gastrointestinal: Positive for nausea. Negative for vomiting, abdominal pain and diarrhea.  Musculoskeletal: Positive for neck pain.  Neurological: Negative for weakness and numbness.  All other systems reviewed and are negative.   Allergies  Flexeril  Home Medications   Prior to Admission medications   Medication Sig Start Date End Date Taking? Authorizing Provider  Aspirin-Acetaminophen-Caffeine (EXCEDRIN PO) Take 1 tablet  by mouth as needed.    Historical Provider, MD  Biotin (BIOTIN MAXIMUM STRENGTH) 10 MG TABS Take 1 tablet by mouth daily.    Historical Provider, MD  cholecalciferol (VITAMIN D) 1000 UNITS tablet Take 1,000 Units by mouth daily.    Historical Provider, MD  GARLIC 99991111 PO Take 1 capsule by mouth daily.    Historical Provider, MD  Ginkgo Biloba (GINKOBA PO) Take 1 capsule by mouth daily.    Historical Provider, MD  KRILL OIL PO Take by mouth.    Historical Provider, MD  montelukast (SINGULAIR) 10 MG tablet Take 1 tablet (10 mg total) by mouth at bedtime. 02/06/15   Ria Bush, MD  naproxen (NAPROSYN) 500 MG tablet Take one po bid x 5 days then prn pain, take with food Patient not taking: Reported on 06/30/2015 06/24/14   Ria Bush, MD  SYNTHROID 100 MCG tablet Take 1 tablet (100 mcg total) by mouth daily before breakfast. 07/10/15   Ria Bush, MD  vitamin B-12 (CYANOCOBALAMIN) 1000 MCG tablet Take 1,000 mcg by mouth daily.    Historical Provider, MD  vitamin E 400 UNIT capsule Take 400 Units by mouth daily.    Historical Provider, MD   BP 132/87 mmHg  Pulse 64  Temp(Src) 97.7 F (36.5 C) (Oral)  Resp 16  SpO2 99%   Physical Exam  Constitutional: She is oriented to person, place, and time. She appears well-developed and well-nourished. No distress.  Nontoxic/nonseptic appearing  HENT:  Head: Normocephalic and atraumatic.  Eyes: Conjunctivae and EOM are normal. No scleral icterus.  Neck: Normal range of motion.  No JVD. No meningismus.  Cardiovascular: Normal rate, regular rhythm and intact distal pulses.   Pulmonary/Chest: Effort normal and breath sounds normal. No respiratory distress. She has no wheezes. She has no rales.  Lungs CTAB. Chest expansion symmetric.  Abdominal: Soft. She exhibits no distension. There is no tenderness. There is no rebound and no guarding.  Musculoskeletal: Normal range of motion.  Neurological: She is alert and oriented to person, place, and  time. She exhibits normal muscle tone. Coordination normal.  GCS 15. Patient moving all extremities.  Skin: Skin is warm and dry. No rash noted. She is not diaphoretic. No erythema. No pallor.  Psychiatric: She has a normal mood and affect. Her behavior is normal.  Nursing note and vitals reviewed.   ED Course  Procedures (including critical care time) Labs Review Labs Reviewed  T4, FREE - Abnormal; Notable for the following:    Free T4 1.24 (*)    All other components within normal limits  BASIC METABOLIC PANEL  CBC  TSH  D-DIMER, QUANTITATIVE (NOT AT [facility])  I-STAT TROPOININ, ED    Imaging Review Dg Chest 2 View  10/07/2015  CLINICAL DATA:  Right chest pain, right shoulder pain, right neck pain, pain behind the right ear and shortness of breath. EXAM: CHEST  2 VIEW COMPARISON:  10/01/2013. FINDINGS: Normal sized heart. Clear lungs. Minimal lower thoracic spine degenerative changes. IMPRESSION: No  acute abnormality. Electronically Signed   By: Claudie Revering M.D.   On: 10/07/2015 18:01   I have personally reviewed and evaluated these images and lab results as part of my medical decision-making.   EKG Interpretation   Date/Time:  Tuesday Oct 07 2015 17:12:09 EDT Ventricular Rate:  73 PR Interval:  120 QRS Duration: 78 QT Interval:  408 QTC Calculation: 449 R Axis:   83 Text Interpretation:  Normal sinus rhythm Normal ECG No old tracing to  compare Confirmed by [facility]  MD, ELLIOTT [phone]) on 10/07/2015 9:49:18 PM      MDM   Final diagnoses:  Symptomatic bradycardia  Palpitations  Secondary hypertension, unspecified    45 year old female with a history of thyroid cancer, status post thyroidectomy and radioiodine ablation presents to the emergency department for evaluation of right-sided superior pressure-like chest pain which began at 1230 today. Patient reports having similar symptoms in the past. She noticed that palpitations, associated with her pressure, were correlated  with a drop in her heart rate to the 50s. EKG today shows normal sinus rhythm. No ischemic changes. Troponin is negative. D-dimer ordered to evaluate risk for blood clot given cancer history. D-dimer today is negative. Low suspicion for pulmonary embolus especially given lack of tachycardia, tachypnea, dyspnea, or hypoxia. Remainder of laboratory workup is noncontributory. No concerning findings for myxedema coma. Chest x-ray negative for acute cardiopulmonary findings. No mediastinal widening to suggest dissection.  Patient reevaluated and found to have no acute worsening of her symptoms. Her vitals have been stable. Patient was noted to be mildly hypertensive. She has no history of hypertension. I have recommended that the patient have her blood pressure rechecked by her primary care provider. Given reported history of bradycardia, patient also referred to cardiology. I have discussed that she may benefit from a Holter monitor to further evaluate her symptoms. No indication for further emergent workup or imaging at this time. Patient discharged from the ED in good condition with no unaddressed concerns.    Filed Vitals:   10/07/15 2100 10/07/15 2130 10/07/15 2200 10/07/15 2230  BP: 138/100 128/96 139/98 132/87  Pulse: 57 57 63 64  Temp:      TempSrc:      Resp: 16 18 16 16   SpO2: 100% 100% 98% 99%     Antonietta Breach, PA-C 10/07/15 2331  Daleen Bo, MD 10/08/15 1514

## 2015-10-10 NOTE — Telephone Encounter (Signed)
Seen at ER with mild hypertension and palpitations. Would call for f/u, see if she's interested in cards referral to discuss holter monitor (we discussed this at her last visit 06/2015). I'm not sure if ER referred her or not.

## 2015-10-10 NOTE — Telephone Encounter (Signed)
Spoke with patient. She said she is feeling better today and didn't know if ER made a referral for her bc she hasn't heard from anyone. She didn't really think she needed to go to cards, per se. Advised patient that you wouldn't have mentioned it if you weren't leaning that direction. She finally agreed and said she would see Dr. Rockey Situ if we would place referral for her. She will await call for scheduling.

## 2015-10-13 NOTE — Telephone Encounter (Signed)
Referral placed.

## 2015-10-14 ENCOUNTER — Encounter (HOSPITAL_COMMUNITY): Payer: Self-pay

## 2015-10-14 ENCOUNTER — Emergency Department (HOSPITAL_COMMUNITY)

## 2015-10-14 ENCOUNTER — Emergency Department (HOSPITAL_COMMUNITY)
Admission: EM | Admit: 2015-10-14 | Discharge: 2015-10-14 | Disposition: A | Discharge status: Home / Self Care | Attending: Emergency Medicine | Admitting: Emergency Medicine

## 2015-10-14 DIAGNOSIS — S61210A Laceration without foreign body of right index finger without damage to nail, initial encounter: Secondary | ICD-10-CM | POA: Diagnosis not present

## 2015-10-14 DIAGNOSIS — Z85850 Personal history of malignant neoplasm of thyroid: Secondary | ICD-10-CM | POA: Diagnosis not present

## 2015-10-14 DIAGNOSIS — Z79899 Other long term (current) drug therapy: Secondary | ICD-10-CM | POA: Insufficient documentation

## 2015-10-14 DIAGNOSIS — Z8669 Personal history of other diseases of the nervous system and sense organs: Secondary | ICD-10-CM | POA: Insufficient documentation

## 2015-10-14 DIAGNOSIS — Y998 Other external cause status: Secondary | ICD-10-CM | POA: Insufficient documentation

## 2015-10-14 DIAGNOSIS — E890 Postprocedural hypothyroidism: Secondary | ICD-10-CM | POA: Diagnosis not present

## 2015-10-14 DIAGNOSIS — Z8709 Personal history of other diseases of the respiratory system: Secondary | ICD-10-CM | POA: Diagnosis not present

## 2015-10-14 DIAGNOSIS — IMO0002 Reserved for concepts with insufficient information to code with codable children: Secondary | ICD-10-CM

## 2015-10-14 DIAGNOSIS — S6990XA Unspecified injury of unspecified wrist, hand and finger(s), initial encounter: Secondary | ICD-10-CM

## 2015-10-14 DIAGNOSIS — W228XXA Striking against or struck by other objects, initial encounter: Secondary | ICD-10-CM | POA: Insufficient documentation

## 2015-10-14 DIAGNOSIS — Y9289 Other specified places as the place of occurrence of the external cause: Secondary | ICD-10-CM | POA: Insufficient documentation

## 2015-10-14 DIAGNOSIS — S6991XA Unspecified injury of right wrist, hand and finger(s), initial encounter: Secondary | ICD-10-CM | POA: Diagnosis present

## 2015-10-14 DIAGNOSIS — Z8679 Personal history of other diseases of the circulatory system: Secondary | ICD-10-CM | POA: Diagnosis not present

## 2015-10-14 DIAGNOSIS — Y9389 Activity, other specified: Secondary | ICD-10-CM | POA: Insufficient documentation

## 2015-10-14 DIAGNOSIS — R200 Anesthesia of skin: Secondary | ICD-10-CM | POA: Diagnosis not present

## 2015-10-14 IMAGING — CR DG FINGER INDEX 2+V*R*
3 series · 3 of 3 positions shown · non-contrast
Comparison: None.

CLINICAL DATA: Second digit closed in car door with pain, initial encounter

EXAM:
RIGHT INDEX FINGER 2+V

[finger ap]
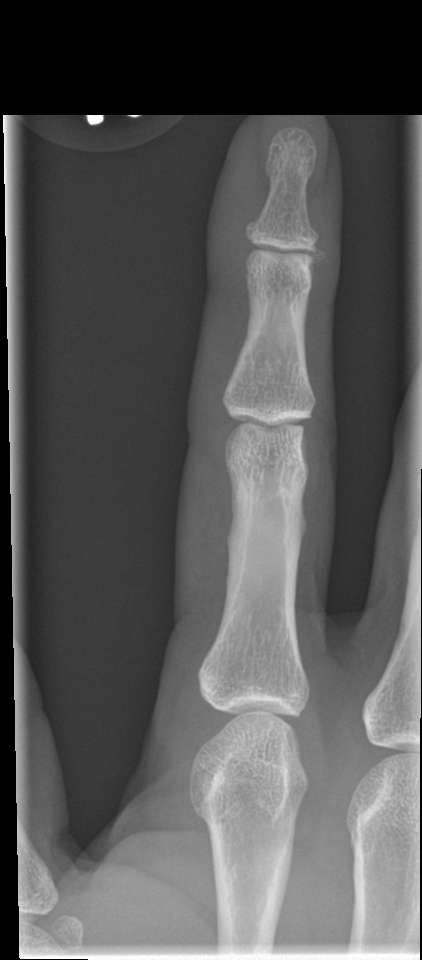

[finger obl]
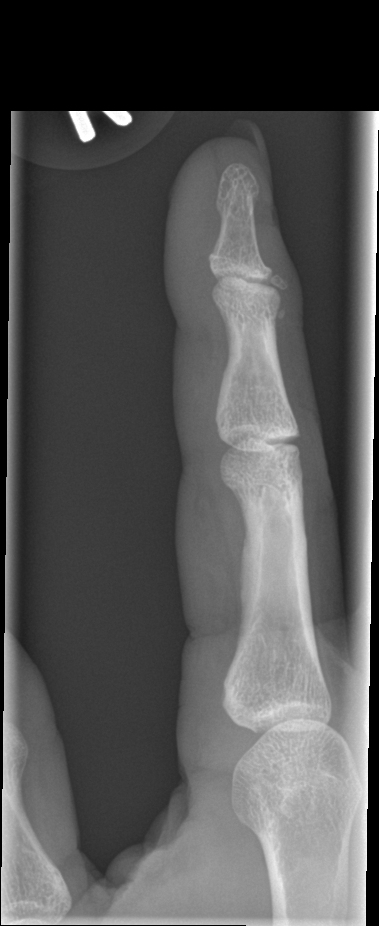

[finger lat]
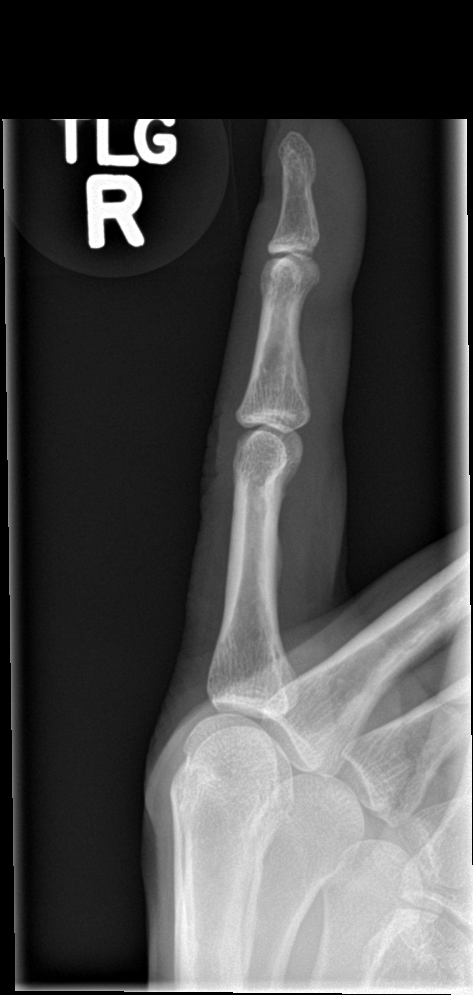

[3 of 3 positions shown; findings below may reference images not displayed]

FINDINGS: There is no evidence of fracture or dislocation. Some chronic bony
densities adjacent to the DIP joint are seen. Soft tissues are
unremarkable.
IMPRESSION: No acute abnormality noted.

## 2015-10-14 MED ORDER — LIDOCAINE HCL (PF) 1 % IJ SOLN
5.0000 mL | Freq: Once | INTRAMUSCULAR | Status: AC
  Administered 2015-10-14: 5 mL
  Filled 2015-10-14: qty 5

## 2015-10-14 NOTE — ED Provider Notes (Signed)
CSN: [id number]     Arrival date & time 10/14/15  1843 History  By signing my name below, I, [name], attest that this documentation has been prepared under the direction and in the presence of non-physician practitioner, Harlene Ramus, PA-C. Electronically Signed: [name], Scribe. 10/14/2015. 6:57 PM.   Chief Complaint  Patient presents with  . Finger Injury    The history is provided by the patient. No language interpreter was used.     HPI Comments: Maria Ray is a 45 y.o. female who presents to the Emergency Department complaining of right index finger crush injury sustained less than 1 hour ago. She states that her right index finger was injured after a door was closed on it. She describes her pain as a 9/10 and notes associated swelling. Pt also endorses numbness/tingling in her right index finger. Pt endorses pain exacerbation with palpation to her right index finger and with bending of her right index finger. She notes no other pains. Pt has no known allergies. She does not use blood thinners. Pt denies neuro deficits or any other associated symptoms. Tetanus UTD.  Past Medical History  Diagnosis Date  . Allergic rhinitis   . Papillary thyroid carcinoma ([place]) 09/03/2013    s/p thyroidectomy then radioiodine ablation per endo ([facility])  . Migraine     "maybe a couple times/yr" (10/09/2013)  . Cluster headache   . Hoarseness of voice 01/2014    after thyroidectomy s/p ENT eval ?GERD - did not improve w/ PPI trial  . Post-surgical hypothyroidism 2015    for papillary thyroid cancer   Past Surgical History  Procedure Laterality Date  . Mri  2010    brain WNL per patient  . Tympanostomy  child  . Thyroidectomy, partial  10/09/2013  . Tonsillectomy and adenoidectomy  1976  . Carpal tunnel with cubital tunnel Bilateral 2004-2006  . Abdominal hysterectomy  2010    partial; heavy bleeding, ovaries remain  . Reduction mammaplasty  1990  . Thyroidectomy N/A 10/09/2013     Procedure: THYROIDECTOMY SUBTOTAL;  Surgeon: Harl Bowie, MD   Family History  Problem Relation Age of Onset  . Hypertension Father   . Cancer Paternal Grandfather 30    colon  . Cancer Maternal Grandfather     bone?  . Stroke Neg Hx   . Diabetes Neg Hx   . CAD Father 21    stent   Social History  Substance Use Topics  . Smoking status: Never Smoker   . Smokeless tobacco: Never Used  . Alcohol Use: No   OB History    No data available     Review of Systems  Musculoskeletal: Positive for joint swelling and arthralgias.  Skin: Positive for wound.  Neurological: Positive for numbness.       Negative for sensation loss.   Allergies  Flexeril  Home Medications   Prior to Admission medications   Medication Sig Start Date End Date Taking? Authorizing Provider  Aspirin-Acetaminophen-Caffeine (EXCEDRIN PO) Take 1 tablet by mouth as needed.    Historical Provider, MD  Biotin (BIOTIN MAXIMUM STRENGTH) 10 MG TABS Take 1 tablet by mouth daily.    Historical Provider, MD  cholecalciferol (VITAMIN D) 1000 UNITS tablet Take 1,000 Units by mouth daily.    Historical Provider, MD  GARLIC 99991111 PO Take 1 capsule by mouth daily.    Historical Provider, MD  Ginkgo Biloba (GINKOBA PO) Take 1 capsule by mouth daily.    Historical Provider,  MD  KRILL OIL PO Take by mouth.    Historical Provider, MD  montelukast (SINGULAIR) 10 MG tablet Take 1 tablet (10 mg total) by mouth at bedtime. 02/06/15   Ria Bush, MD  naproxen (NAPROSYN) 500 MG tablet Take one po bid x 5 days then prn pain, take with food Patient not taking: Reported on 06/30/2015 06/24/14   Ria Bush, MD  SYNTHROID 100 MCG tablet Take 1 tablet (100 mcg total) by mouth daily before breakfast. 07/10/15   Ria Bush, MD  vitamin B-12 (CYANOCOBALAMIN) 1000 MCG tablet Take 1,000 mcg by mouth daily.    Historical Provider, MD  vitamin E 400 UNIT capsule Take 400 Units by mouth daily.    Historical Provider, MD   BP  139/81 mmHg  Pulse 64  Temp(Src) 98 F (36.7 C) (Oral)  Resp 16  Ht 5\' 7"  (1.702 m)  Wt 59.875 kg  BMI 20.67 kg/m2  SpO2 100% Physical Exam  Constitutional: She is oriented to person, place, and time. She appears well-developed and well-nourished. No distress.  HENT:  Head: Normocephalic and atraumatic.  Eyes: Conjunctivae and EOM are normal.  Neck: Neck supple. No tracheal deviation present.  Cardiovascular: Normal rate.   Pulmonary/Chest: Effort normal. No respiratory distress.  Musculoskeletal: Normal range of motion.  Tender to palpation at right second PIP and DIP, middle and proximal phalanx. FROM of right second MCP, PIP, and DIP with 5/5 strength. Sensation grossly intact. 2+ radial pulse. 1 cm linear laceration noted to right second finger pad; no active bleeding.  Neurological: She is alert and oriented to person, place, and time.  Skin: Skin is warm and dry.  Gel nail polish present over all nails; no visible subungual hematoma noted to proximal nailbed of right second fingernail.  Psychiatric: She has a normal mood and affect. Her behavior is normal.  Nursing note and vitals reviewed.   ED Course  .Marland KitchenLaceration Repair Date/Time: 10/14/2015 8:44 PM Performed by: Nona Dell Authorized by: Nona Dell Consent: Verbal consent obtained. Risks and benefits: risks, benefits and alternatives were discussed Consent given by: patient Patient understanding: patient states understanding of the procedure being performed Patient identity confirmed: verbally with patient Body area: upper extremity Location details: right index finger Laceration length: 1 cm Foreign bodies: no foreign bodies Tendon involvement: none Nerve involvement: none Vascular damage: no Anesthesia: local infiltration Local anesthetic: lidocaine 1% without epinephrine Anesthetic total: 1 ml Preparation: Patient was prepped and draped in the usual sterile fashion. Irrigation  solution: saline Irrigation method: syringe Amount of cleaning: standard Skin closure: 5-0 Prolene Number of sutures: 2 Technique: simple Approximation: close Approximation difficulty: simple Dressing: antibiotic ointment and 4x4 sterile gauze Patient tolerance: Patient tolerated the procedure well with no immediate complications   (including critical care time)  DIAGNOSTIC STUDIES: Oxygen Saturation is 100% on RA, normal by my interpretation.    COORDINATION OF CARE: 6:57 PM Discussed treatment plan with pt at bedside and pt agreed to plan.  Labs Review Labs Reviewed - No data to display  Imaging Review Dg Finger Index Right  10/14/2015  CLINICAL DATA: Second digit closed in car door with pain, initial encounter EXAM: RIGHT INDEX FINGER 2+V COMPARISON:  None. FINDINGS: There is no evidence of fracture or dislocation. Some chronic bony densities adjacent to the DIP joint are seen. Soft tissues are unremarkable. IMPRESSION: No acute abnormality noted. Electronically Signed   By: Inez Catalina M.D.   On: 10/14/2015 20:18   I have personally reviewed  and evaluated these images and lab results as part of my medical decision-making.   EKG Interpretation None      MDM   Final diagnoses:  Finger injury  Laceration    Pressure irrigation performed. Wound explored and base of wound visualized in a bloodless field without evidence of foreign body.  Laceration occurred < 8 hours prior to repair which was well tolerated. Tdap UTD.  Pt has no comorbidities to effect normal wound healing. Pt discharged without antibiotics.  Discussed suture home care with patient and answered questions. Pt to follow-up for wound check and suture removal in 7 days; they are to return to the ED sooner for signs of infection. Pt is hemodynamically stable with no complaints prior to dc.  Discussed strict return precautions with patient and wound care.  I personally performed the services described in this  documentation, which was scribed in my presence. The recorded information has been reviewed and is accurate.   Chesley Noon Dex[place], [place] 10/14/15 2103  Leonard Schwartz, MD 10/16/15 [phone]

## 2015-10-14 NOTE — Discharge Instructions (Signed)
You may take 800 mg ibuprofen 3 times daily as needed for pain relief. I recommend eating prior to taking ibuprofen to prevent gastrointestinal side effects. You may also apply ice and elevate your finger for 15-20 minutes 2-4 times daily to help with pain and swelling. Keep wound clean using antibacterial soap (Dial) and water, pat dry. You may apply a small amount of over-the-counter antibiotic ointment (bacitracin) to wound daily. Follow-up with your primary care provider or go to an urgent care or return to the ED in 7 days for suture removal. Please return to the Emergency Department if symptoms worsen or new onset of fever, redness, swelling, warmth, numbness, tingling, weakness, drainage.

## 2015-10-14 NOTE — ED Notes (Addendum)
Pt reports the tip of her right index finger was slammed in a car door and she has small laceration to tip of finger, bleeding is controlled, mild bruise

## 2015-10-14 NOTE — ED Notes (Signed)
See PAs notes for secondary assessment.  

## 2015-11-03 ENCOUNTER — Encounter: Payer: Self-pay | Admitting: Family Medicine

## 2015-11-03 ENCOUNTER — Ambulatory Visit (INDEPENDENT_AMBULATORY_CARE_PROVIDER_SITE_OTHER): Admitting: Family Medicine

## 2015-11-03 VITALS — BP 120/80 | HR 56 | Temp 97.3°F | Ht 67.0 in | Wt 130.2 lb

## 2015-11-03 DIAGNOSIS — R590 Localized enlarged lymph nodes: Secondary | ICD-10-CM | POA: Insufficient documentation

## 2015-11-03 DIAGNOSIS — M79641 Pain in right hand: Secondary | ICD-10-CM | POA: Diagnosis not present

## 2015-11-03 NOTE — Progress Notes (Signed)
BP 120/80 mmHg  Pulse 56  Temp(Src) 97.3 F (36.3 C)  Ht 5\' 7"  (1.702 m)  Wt 130 lb 3.2 oz (59.058 kg)  BMI 20.39 kg/m2  SpO2 98%   CC: check neck swelling  Subjective:    Patient ID: Maria Ray, female    DOB: 02/11/1971, 45 y.o.   MRN: [id number]  HPI: Maria Ray is a 45 y.o. female presenting on 11/03/2015 for Facial Swelling   Recent possible bug bites vs poison ivy to R neck along with some swollen glands. Allergies acting up. Has been taking ibuprofen. Daughter sick with GI illness at home. Pt without GI sxs.  Recent root canal.  No fevers/chills.   Ongoing sore hands with any exertion - describes tired ache of hands . No other joints affected. Increased activity recently.   Recent ER visits - symptomatic bradycardia with palpations then finger laceration. Referred to cardiology - has appt with Dr Rockey Situ 6/28.   Upcoming trips to [place], [place], [place].  Hypothyroidism after thyroidectomy and RAI for papillary thyroid cancer - sees Dr Gabriel Carina.   New kitten at home  Relevant past medical, surgical, family and social history reviewed and updated as indicated. Interim medical history since our last visit reviewed. Allergies and medications reviewed and updated. Current Outpatient Prescriptions on File Prior to Visit  Medication Sig  . Biotin (BIOTIN MAXIMUM STRENGTH) 10 MG TABS Take 1 tablet by mouth daily.  . cholecalciferol (VITAMIN D) 1000 UNITS tablet Take 1,000 Units by mouth daily.  Marland Kitchen GARLIC 99991111 PO Take 1 capsule by mouth daily.  . Ginkgo Biloba (GINKOBA PO) Take 1 capsule by mouth daily.  Marland Kitchen KRILL OIL PO Take by mouth.  . levothyroxine (SYNTHROID, LEVOTHROID) 88 MCG tablet Take 88 mcg by mouth daily before breakfast.  . vitamin B-12 (CYANOCOBALAMIN) 1000 MCG tablet Take 1,000 mcg by mouth daily.  . vitamin E 400 UNIT capsule Take 400 Units by mouth daily.   No current facility-administered medications on file prior to visit.    Review of Systems Per HPI  unless specifically indicated in ROS section     Objective:    BP 120/80 mmHg  Pulse 56  Temp(Src) 97.3 F (36.3 C)  Ht 5\' 7"  (1.702 m)  Wt 130 lb 3.2 oz (59.058 kg)  BMI 20.39 kg/m2  SpO2 98%  Wt Readings from Last 3 Encounters:  11/03/15 130 lb 3.2 oz (59.058 kg)  10/14/15 132 lb (59.875 kg)  06/30/15 129 lb 6 oz (58.684 kg)    Physical Exam  Constitutional: She appears well-developed and well-nourished. No distress.  HENT:  Mouth/Throat: Oropharynx is clear and moist. No oropharyngeal exudate.  Eyes: Conjunctivae and EOM are normal. Pupils are equal, round, and reactive to light. No scleral icterus.  Neck: Normal range of motion. Neck supple. No thyromegaly present.  R>L tonsillar LAD sub cm  Musculoskeletal: She exhibits no edema.  Tender to palpation at R 1st CMC No other joint pain FROM at wrist and digits bilaterally  Lymphadenopathy:    She has cervical adenopathy.  Skin: Skin is warm and dry. No rash noted.  2 small bug bites R clavicle area as well as on small bite right lateral neck, minimal surrounding erythema, pruritis present  Nursing note and vitals reviewed.  Results for orders placed or performed during the hospital encounter of [id number]  Basic metabolic panel  Result Value Ref Range   Sodium 141 135 - 145 mmol/L   Potassium 3.7 3.5 - 5.1  mmol/L   Chloride 105 101 - 111 mmol/L   CO2 25 22 - 32 mmol/L   Glucose, Bld 86 65 - 99 mg/dL   BUN 11 6 - 20 mg/dL   Creatinine, Ser 0.82 0.44 - 1.00 mg/dL   Calcium 9.8 8.9 - 10.3 mg/dL   GFR calc non Af Amer >60 >60 mL/min   GFR calc Af Amer >60 >60 mL/min   Anion gap 11 5 - 15  CBC  Result Value Ref Range   WBC 7.5 4.0 - 10.5 K/uL   RBC 4.80 3.87 - 5.11 MIL/uL   Hemoglobin 14.1 12.0 - 15.0 g/dL   HCT 43.5 36.0 - 46.0 %   MCV 90.6 78.0 - 100.0 fL   MCH 29.4 26.0 - 34.0 pg   MCHC 32.4 30.0 - 36.0 g/dL   RDW 12.3 11.5 - 15.5 %   Platelets 270 150 - 400 K/uL  TSH  Result Value Ref Range   TSH 0.496  0.350 - 4.500 uIU/mL  D-dimer, quantitative (not at [facility])  Result Value Ref Range   D-Dimer, Quant <0.27 0.00 - 0.50 ug/mL-FEU  T4, free  Result Value Ref Range   Free T4 1.24 (H) 0.61 - 1.12 ng/dL  I-stat troponin, ED  Result Value Ref Range   Troponin i, poc 0.00 0.00 - 0.08 ng/mL   Comment 3              Assessment & Plan:   Problem List Items Addressed This Visit    LAD (lymphadenopathy) of right cervical region - Primary    Reassured likely reactive LAD after bug bite.       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          Follow up plan: Return if symptoms worsen or fail to improve.  Maria Bush, MD

## 2015-11-03 NOTE — Progress Notes (Signed)
Pre visit review using our clinic review tool, if applicable. No additional management support is needed unless otherwise documented below in the visit note. 

## 2015-11-03 NOTE — Assessment & Plan Note (Signed)
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

## 2015-11-03 NOTE — Patient Instructions (Addendum)
For upcoming trip, make sure you're up to date with MMR, Tdap, and Hep A/B.  For right hand - likely beginnings of hand arthritis. For swollen glands in neck - I think this is benign reactive lymph node swelling from recent bug bites. Let us know if not improving over time.  Have a safe trip!

## 2015-11-03 NOTE — Assessment & Plan Note (Signed)
Reassured likely reactive LAD after bug bite.

## 2015-11-19 ENCOUNTER — Ambulatory Visit: Admitting: Cardiovascular Disease

## 2015-11-26 ENCOUNTER — Encounter: Payer: Self-pay | Admitting: Cardiovascular Disease

## 2015-11-26 ENCOUNTER — Ambulatory Visit (INDEPENDENT_AMBULATORY_CARE_PROVIDER_SITE_OTHER): Admitting: Cardiovascular Disease

## 2015-11-26 VITALS — BP 128/82 | HR 62 | Ht 67.0 in | Wt 130.2 lb

## 2015-11-26 DIAGNOSIS — C73 Malignant neoplasm of thyroid gland: Secondary | ICD-10-CM

## 2015-11-26 DIAGNOSIS — R002 Palpitations: Secondary | ICD-10-CM

## 2015-11-26 DIAGNOSIS — R079 Chest pain, unspecified: Secondary | ICD-10-CM | POA: Diagnosis not present

## 2015-11-26 NOTE — Patient Instructions (Signed)
Medication Instructions:   No medication changes  Please call if palpitations get worse   Follow-Up: It was a pleasure seeing you in the office today. Please call us if you have new issues that need to be addressed before your next appt.  [phone]  Your physician wants you to follow-up in: As needed  If you need a refill on your cardiac medications before your next appointment, please call your pharmacy.

## 2015-11-26 NOTE — Progress Notes (Signed)
Patient ID: Maria Ray, female   DOB: 1970/11/10, 45 y.o.   MRN: [id number] Cardiology Office Note  Date:  11/26/2015   ID:  Maria Ray, DOB May 12, 1971, MRN [id number]  PCP:  Ria Bush, MD   Chief Complaint  Patient presents with  . other    Ref by Dr. Danise Mina for HTN & palpitations. Meds reviewed by the patient verbally.     HPI:  45 year old woman with history of papillary thyroid carcinoma, s/p thyroidectomy and radioiodine ablation presenting for evaluation of palpitations, bradycardia. Referred for consultation by Dr. Danise Mina  She reports long history of thyroid cancer with thyroid resection May 2015 followed by iodine ablation in 2016. She had positive lymph nodes. She reports having intermittent palpitations, notable in the evenings. Symptoms were severe 6-8 months ago, short-lived, several extra beats then they resolved without intervention. Symptoms seem to get better as thyroid dose was decreased from 100 g down to 88 (with heart dose of 132 1 day per week). She has noticed a dramatic improvement in symptoms.  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 Recently traveled and neuro without any complications  She has noticed slow heart rate at times, to creatinine rest Has been asymptomatic, in general blood pressure 120s up to 140s  One episode of chest pain May 2017, did not feel well, had been in meetings for 2 days Broad lab work done in the hospital emergency room which was unrevealing, sent home No further episodes since that time  Nonsmoker, no diabetes,  low cholesterol of 140  EKG on today's visit shows normal sinus rhythm with rate 62 bpm, no significant ST or T-wave changes  PMH:   has a past medical history of Allergic rhinitis; Migraine; Cluster headache; Hoarseness of voice (01/2014); Post-surgical hypothyroidism (2015); and Papillary thyroid carcinoma ([place])  (09/03/2013).  PSH:    Past Surgical History  Procedure Laterality Date  . Mri  2010    brain WNL per patient  . Tympanostomy  child  . Thyroidectomy, partial  10/09/2013  . Tonsillectomy and adenoidectomy  1976  . Carpal tunnel with cubital tunnel Bilateral 2004-2006  . Abdominal hysterectomy  2010    partial; heavy bleeding, ovaries remain  . Reduction mammaplasty  1990  . Thyroidectomy N/A 10/09/2013    Procedure: THYROIDECTOMY SUBTOTAL;  Surgeon: Harl Bowie, MD    Current Outpatient Prescriptions  Medication Sig Dispense Refill  . Ascorbic Acid (VITAMIN C) 100 MG tablet Take 100 mg by mouth daily.    . Biotin (BIOTIN MAXIMUM STRENGTH) 10 MG TABS Take 1 tablet by mouth daily.    . cholecalciferol (VITAMIN D) 1000 UNITS tablet Take 1,000 Units by mouth daily.    Marland Kitchen GARLIC 99991111 PO Take 1 capsule by mouth daily.    . Ginkgo Biloba (GINKOBA PO) Take 1 capsule by mouth daily.    Marland Kitchen KRILL OIL PO Take by mouth.    . levothyroxine (SYNTHROID, LEVOTHROID) 88 MCG tablet Take 88 mcg by mouth daily before breakfast.    . magnesium 30 MG tablet Take 30 mg by mouth 2 (two) times daily.    . Multiple Vitamin (MULTIVITAMIN) tablet Take 1 tablet by mouth daily.    . Potassium 99 MG TABS Take 1 tablet by mouth daily.    . vitamin B-12 (CYANOCOBALAMIN) 1000 MCG tablet Take 1,000 mcg by mouth daily.    . vitamin E 400 UNIT capsule Take 400 Units by  mouth daily.     No current facility-administered medications for this visit.     Allergies:   Flexeril   Social History:  The patient  reports that she has never smoked. She has never used smokeless tobacco. She reports that she does not drink alcohol or use illicit drugs.   Family History:   family history includes CAD (age of onset: 49) in her father; Cancer in her maternal grandfather; Cancer (age of onset: 71) in her paternal grandfather; Hypertension in her father. There is no history of Stroke or Diabetes.    Review of Systems: Review  of Systems  Constitutional: Negative.   Respiratory: Negative.   Cardiovascular: Positive for chest pain and palpitations.  Gastrointestinal: Negative.   Musculoskeletal: Negative.   Neurological: Negative.   Psychiatric/Behavioral: Negative.   All other systems reviewed and are negative.    PHYSICAL EXAM: VS:  BP 128/82 mmHg  Pulse 62  Ht 5\' 7"  (1.702 m)  Wt 130 lb 4 oz (59.081 kg)  BMI 20.40 kg/m2 , BMI Body mass index is 20.4 kg/(m^2). GEN: Well nourished, well developed, in no acute distress, thin HEENT: normal Neck: no JVD, carotid bruits, or masses Cardiac: RRR; no murmurs, rubs, or gallops,no edema  Respiratory:  clear to auscultation bilaterally, normal work of breathing GI: soft, nontender, nondistended, + BS MS: no deformity or atrophy Skin: warm and dry, no rash Neuro:  Strength and sensation are intact Psych: euthymic mood, full affect    Recent Labs: 10/07/2015: BUN 11; Creatinine, Ser 0.82; Hemoglobin 14.1; Platelets 270; Potassium 3.7; Sodium 141; TSH 0.496    Lipid Panel Lab Results  Component Value Date   CHOL 143 08/28/2014   HDL 56.90 08/28/2014   LDLCALC 64 08/28/2014   TRIG 113.0 08/28/2014      Wt Readings from Last 3 Encounters:  11/26/15 130 lb 4 oz (59.081 kg)  11/03/15 130 lb 3.2 oz (59.058 kg)  10/14/15 132 lb (59.875 kg)       ASSESSMENT AND PLAN:  Palpitations Likely having APCs. Unable to exclude PVCs or short run of atrial tachycardia Symptoms have resolved as thyroid medication dose has decreased She is relatively asymptomatic now, does not want further workup We discussed possible options if symptoms return such as Holter monitor, 30 day monitor, beta blocker such as propranolol as needed Otherwise clinical exam is essentially benign, normal EKG  Chest pain, unspecified chest pain type Atypical in nature, very low risk of coronary disease as she does not have any risk factors Normal EKG, recommended she call if symptoms  recur  Papillary thyroid carcinoma ([place]) History of surgical resection, iodine ablation Managed by endocrinology   Disposition:   F/U  as needed  No orders of the defined types were placed in this encounter.     Total encounter time more than 45 minutes  Greater than 50% was spent in counseling and coordination of care with the patient   Signed, Esmond Plants, M.D., Ph.D. 11/26/2015  [place], [place]

## 2016-03-04 ENCOUNTER — Ambulatory Visit (INDEPENDENT_AMBULATORY_CARE_PROVIDER_SITE_OTHER)

## 2016-03-04 DIAGNOSIS — Z23 Encounter for immunization: Secondary | ICD-10-CM

## 2016-06-25 ENCOUNTER — Ambulatory Visit (INDEPENDENT_AMBULATORY_CARE_PROVIDER_SITE_OTHER): Admitting: Family Medicine

## 2016-06-25 ENCOUNTER — Encounter: Payer: Self-pay | Admitting: Family Medicine

## 2016-06-25 VITALS — BP 108/72 | HR 80 | Temp 97.9°F | Wt 136.0 lb

## 2016-06-25 DIAGNOSIS — N644 Mastodynia: Secondary | ICD-10-CM | POA: Diagnosis not present

## 2016-06-25 NOTE — Patient Instructions (Signed)
Use cotton bra See our referral coordinator to schedule mammogram/ultrasound at your convenience. Nice to see you today.

## 2016-06-25 NOTE — Progress Notes (Signed)
Pre visit review using our clinic review tool, if applicable. No additional management support is needed unless otherwise documented below in the visit note. 

## 2016-06-25 NOTE — Assessment & Plan Note (Addendum)
Benign exam. Anticipate related to change in bra type recently leading to possible irritation or R breast - rec change back to supportive cotton bra.  As no mammogram yet, did recommend we get baseline. Ordered bilat dx mammo and R [place].  Pt agrees with plan.

## 2016-06-25 NOTE — Progress Notes (Signed)
BP 108/72   Pulse 80   Temp 97.9 F (36.6 C) (Oral)   Wt 136 lb (61.7 kg) Comment: per patient-refused to weigh  BMI 21.30 kg/m    CC: R breast pain Subjective:    Patient ID: Maria Ray, female    DOB: 1971-04-16, 46 y.o.   MRN: [id number]  HPI: Maria Ray is a 46 y.o. female presenting on 06/25/2016 for Breast Pain (right)   Noticing intermittent sharp pain R breast at scar tissue over last few weeks. No new lumps or nodules. No swollen glands. Feels well overall.  H/o breast reduction surgery and areolar reconstruction 1990s.  She did recently change bra brand to victoria's secret with more push up and underwire than prior.   Has declined mammo in the past - she regularly does breast exams at home.  H/o hysterectomy 2010 for heavy bleeding, ovaries remain.  No fmhx breast cancer. Hypothyroidism after thyroidectomy and RAI for papillary thyroid cancer - sees Dr Gabriel Carina.   Relevant past medical, surgical, family and social history reviewed and updated as indicated. Interim medical history since our last visit reviewed. Allergies and medications reviewed and updated. Current Outpatient Prescriptions on File Prior to Visit  Medication Sig  . Ascorbic Acid (VITAMIN C) 100 MG tablet Take 100 mg by mouth daily.  . Biotin (BIOTIN MAXIMUM STRENGTH) 10 MG TABS Take 1 tablet by mouth daily.  . cholecalciferol (VITAMIN D) 1000 UNITS tablet Take 1,000 Units by mouth daily.  Marland Kitchen GARLIC 99991111 PO Take 1 capsule by mouth daily.  . Ginkgo Biloba (GINKOBA PO) Take 1 capsule by mouth daily.  Marland Kitchen KRILL OIL PO Take by mouth.  . magnesium 30 MG tablet Take 30 mg by mouth 2 (two) times daily.  . Multiple Vitamin (MULTIVITAMIN) tablet Take 1 tablet by mouth daily.  . Potassium 99 MG TABS Take 1 tablet by mouth daily.  . vitamin B-12 (CYANOCOBALAMIN) 1000 MCG tablet Take 1,000 mcg by mouth daily.  . vitamin E 400 UNIT capsule Take 400 Units by mouth daily.   No current facility-administered  medications on file prior to visit.     Review of Systems Per HPI unless specifically indicated in ROS section     Objective:    BP 108/72   Pulse 80   Temp 97.9 F (36.6 C) (Oral)   Wt 136 lb (61.7 kg) Comment: per patient-refused to weigh  BMI 21.30 kg/m   Wt Readings from Last 3 Encounters:  06/25/16 136 lb (61.7 kg)  11/26/15 130 lb 4 oz (59.1 kg)  11/03/15 130 lb 3.2 oz (59.1 kg)    Physical Exam  Constitutional: She appears well-developed and well-nourished. No distress.  Pulmonary/Chest: Right breast exhibits tenderness. Right breast exhibits no inverted nipple, no mass, no nipple discharge and no skin change. Left breast exhibits no inverted nipple, no mass, no nipple discharge, no skin change and no tenderness. Breasts are symmetrical.    Tender to palpation R breast at 6 oclock position overlying R breast scar, without palpable abnormality  Lymphadenopathy:       Head (right side): No submental, no submandibular, no tonsillar, no preauricular and no posterior auricular adenopathy present.       Head (left side): No submental, no submandibular, no tonsillar, no preauricular and no posterior auricular adenopathy present.    She has no axillary adenopathy.       Right axillary: No lateral adenopathy present.       Left axillary: No lateral  adenopathy present.      Right: No supraclavicular adenopathy present.       Left: No supraclavicular adenopathy present.  Nursing note and vitals reviewed.     Assessment & Plan:   Problem List Items Addressed This Visit    Breast pain, right - Primary    Benign exam. Anticipate related to change in bra type recently leading to possible irritation or R breast - rec change back to supportive cotton bra.  As no mammogram yet, did recommend we get baseline. Ordered bilat dx mammo and R [place].  Pt agrees with plan.      Relevant Orders   MM Digital Diagnostic Bilat   US BREAST LTD UNI LEFT INC AXILLA   US BREAST LTD UNI RIGHT INC  AXILLA       Follow up plan: No Follow-up on file.  Ria Bush, MD

## 2016-07-07 ENCOUNTER — Ambulatory Visit
Admission: RE | Admit: 2016-07-07 | Discharge: 2016-07-07 | Disposition: A | Discharge status: Home / Self Care | Source: Ambulatory Visit | Attending: Family Medicine | Admitting: Family Medicine

## 2016-07-07 ENCOUNTER — Other Ambulatory Visit: Payer: Self-pay | Admitting: Family Medicine

## 2016-07-07 DIAGNOSIS — N644 Mastodynia: Secondary | ICD-10-CM

## 2017-02-08 ENCOUNTER — Telehealth: Payer: Self-pay | Admitting: Family Medicine

## 2017-02-08 NOTE — Telephone Encounter (Signed)
Patient has moved and needs a copy of her immunization records.  Please fax records to patient's business at fax number [phone].

## 2017-02-11 NOTE — Telephone Encounter (Signed)
Immunization record faxed as requested.

## 2020-03-25 ENCOUNTER — Telehealth: Payer: Self-pay | Admitting: Family Medicine

## 2020-03-25 NOTE — Telephone Encounter (Signed)
PT CALLED WANTED TO KNOW ABOUT GETTING HER SHOT RECORD FROM WHEN SHE WAS A PT OF DR.G. PLEASE ADVISE   BEST CONTACT NUMBR [phone]

## 2020-03-25 NOTE — Telephone Encounter (Signed)
Spoke with pt to inform her we show a yearly flu shot 2015-2017 and a Td 05/2008, which she can verify in her MyChart.  States she has had flu shots up to present and a Tdap in 2017, which is what she is looking for.  Expresses her thanks for the call back.

## 2020-03-27 NOTE — Telephone Encounter (Signed)
Thank you :)
